# Patient Record
Sex: Male | Born: 1950 | Race: White | Hispanic: No | Marital: Single | Smoking: Former smoker
Health system: Southern US, Community
[De-identification: ages and names within clinical notes are randomized; demographics above are authoritative.]

## PROBLEM LIST (undated history)

## (undated) DIAGNOSIS — Z9989 Dependence on other enabling machines and devices: Secondary | ICD-10-CM

## (undated) DIAGNOSIS — I10 Essential (primary) hypertension: Secondary | ICD-10-CM

## (undated) DIAGNOSIS — G4733 Obstructive sleep apnea (adult) (pediatric): Secondary | ICD-10-CM

## (undated) DIAGNOSIS — E7800 Pure hypercholesterolemia, unspecified: Secondary | ICD-10-CM

## (undated) DIAGNOSIS — N529 Male erectile dysfunction, unspecified: Secondary | ICD-10-CM

## (undated) HISTORY — DX: Essential (primary) hypertension: I10

## (undated) HISTORY — DX: Obstructive sleep apnea (adult) (pediatric): G47.33

## (undated) HISTORY — DX: Dependence on other enabling machines and devices: Z99.89

## (undated) HISTORY — DX: Pure hypercholesterolemia, unspecified: E78.00

## (undated) HISTORY — DX: Male erectile dysfunction, unspecified: N52.9

## (undated) HISTORY — PX: NO PAST SURGERIES: SHX2092

---

## 1997-11-12 ENCOUNTER — Ambulatory Visit: Admission: RE | Admit: 1997-11-12 | Discharge: 1997-11-12 | Payer: Self-pay | Admitting: *Deleted

## 1999-12-20 ENCOUNTER — Emergency Department (HOSPITAL_COMMUNITY): Admission: EM | Admit: 1999-12-20 | Discharge: 1999-12-20 | Payer: Self-pay | Admitting: Emergency Medicine

## 2000-08-31 ENCOUNTER — Emergency Department (HOSPITAL_COMMUNITY): Admission: EM | Admit: 2000-08-31 | Discharge: 2000-08-31 | Payer: Self-pay | Admitting: *Deleted

## 2008-12-16 ENCOUNTER — Encounter: Payer: Self-pay | Admitting: Internal Medicine

## 2009-01-23 ENCOUNTER — Ambulatory Visit: Payer: Self-pay | Admitting: Internal Medicine

## 2009-01-23 DIAGNOSIS — G4733 Obstructive sleep apnea (adult) (pediatric): Secondary | ICD-10-CM

## 2009-01-23 DIAGNOSIS — J310 Chronic rhinitis: Secondary | ICD-10-CM

## 2009-01-29 DIAGNOSIS — I10 Essential (primary) hypertension: Secondary | ICD-10-CM | POA: Insufficient documentation

## 2009-01-29 DIAGNOSIS — E785 Hyperlipidemia, unspecified: Secondary | ICD-10-CM | POA: Insufficient documentation

## 2009-02-17 ENCOUNTER — Encounter: Payer: Self-pay | Admitting: Internal Medicine

## 2009-10-27 ENCOUNTER — Ambulatory Visit (HOSPITAL_COMMUNITY): Admission: RE | Admit: 2009-10-27 | Discharge: 2009-10-27 | Payer: Self-pay | Admitting: Cardiology

## 2009-10-27 ENCOUNTER — Ambulatory Visit: Payer: Self-pay | Admitting: Cardiology

## 2010-03-03 NOTE — Letter (Signed)
Summary: [facility]   Imported By: Sherian Rein 03/03/2009 08:28:53  _____________________________________________________________________  External Attachment:    Type:   Image     Comment:   External Document

## 2010-04-21 ENCOUNTER — Encounter: Payer: Self-pay | Admitting: *Deleted

## 2010-04-21 DIAGNOSIS — I10 Essential (primary) hypertension: Secondary | ICD-10-CM | POA: Insufficient documentation

## 2010-04-21 DIAGNOSIS — G4733 Obstructive sleep apnea (adult) (pediatric): Secondary | ICD-10-CM | POA: Insufficient documentation

## 2010-04-21 DIAGNOSIS — R011 Cardiac murmur, unspecified: Secondary | ICD-10-CM | POA: Insufficient documentation

## 2010-04-21 DIAGNOSIS — E7800 Pure hypercholesterolemia, unspecified: Secondary | ICD-10-CM | POA: Insufficient documentation

## 2010-04-21 DIAGNOSIS — Z9989 Dependence on other enabling machines and devices: Secondary | ICD-10-CM | POA: Insufficient documentation

## 2010-04-22 ENCOUNTER — Ambulatory Visit: Payer: Self-pay | Admitting: Cardiology

## 2010-05-18 ENCOUNTER — Other Ambulatory Visit: Payer: Self-pay | Admitting: *Deleted

## 2010-05-18 DIAGNOSIS — E7800 Pure hypercholesterolemia, unspecified: Secondary | ICD-10-CM

## 2010-05-21 ENCOUNTER — Ambulatory Visit (INDEPENDENT_AMBULATORY_CARE_PROVIDER_SITE_OTHER): Payer: BC Managed Care – PPO | Admitting: Cardiology

## 2010-05-21 ENCOUNTER — Other Ambulatory Visit (INDEPENDENT_AMBULATORY_CARE_PROVIDER_SITE_OTHER): Payer: BC Managed Care – PPO | Admitting: *Deleted

## 2010-05-21 ENCOUNTER — Encounter: Payer: Self-pay | Admitting: Cardiology

## 2010-05-21 DIAGNOSIS — E7800 Pure hypercholesterolemia, unspecified: Secondary | ICD-10-CM

## 2010-05-21 DIAGNOSIS — E785 Hyperlipidemia, unspecified: Secondary | ICD-10-CM

## 2010-05-21 DIAGNOSIS — I421 Obstructive hypertrophic cardiomyopathy: Secondary | ICD-10-CM

## 2010-05-21 LAB — BASIC METABOLIC PANEL
Calcium: 9.5 mg/dL (ref 8.4–10.5)
Creatinine, Ser: 1.1 mg/dL (ref 0.4–1.5)
GFR: 74.57 mL/min (ref >60.00)
Sodium: 142 mEq/L (ref 135–145)

## 2010-05-21 LAB — LIPID PANEL
HDL: 38.4 mg/dL — ABNORMAL LOW (ref >39.00)
LDL Cholesterol: 81 mg/dL (ref 0–99)
Total CHOL/HDL Ratio: 4
Triglycerides: 174 mg/dL — ABNORMAL HIGH (ref 0.0–149.0)
VLDL: 34.8 mg/dL (ref 0.0–40.0)

## 2010-05-21 LAB — HEPATIC FUNCTION PANEL
Alkaline Phosphatase: 61 U/L (ref 39–117)
Bilirubin, Direct: 0.1 mg/dL (ref 0.0–0.3)
Total Bilirubin: 1 mg/dL (ref 0.3–1.2)

## 2010-05-21 MED ORDER — METOPROLOL SUCCINATE ER 100 MG PO TB24: 100.0000 mg | ORAL_TABLET | Freq: Every day | ORAL | Status: DC

## 2010-05-21 NOTE — Assessment & Plan Note (Addendum)
This patient has a history of IHSS.  His last echocardiogram was 10/27/09.  Since last visit he's been having some occasional dizzy spells.  His not been expressing any chest pain.  His wife notes that he tends to be appearing to be flushed a lot of the time.  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  He does note some occasional exertional dyspnea.  He has been trying to be careful with his diet in terms of cholesterol

## 2010-05-21 NOTE — Assessment & Plan Note (Signed)
The patient remains on Simvastatin 40 mg daily for cholesterol.  He has not been expressing any myalgias.  He has not been getting much regular exercise however.

## 2010-05-21 NOTE — Progress Notes (Signed)
Wayne Beck Date of Birth:  04-Mar-1950 [facility] / [facility] [address]   [address] [place], [place]  [address] [phone]           Fax   [phone]  History of Present Illness: This 60 year old gentleman is seen for a six-month followup office visit.  He has a history of IHSS.  He also has a history of essential hypertension and hypercholesterolemia.  He has a known loud precordial murmur.  He has had some but not total improvement on beta blockers.  He also has a history of sleep apnea and sees Dr. Maple Hudson and uses a CPAP machine.  At the time of his last echocardiogram on 10/27/09 the left ventricular outflow gradient had improved to a maximum of 18 mm mercury.  He does have systolic anterior motion of the mitral valve.  He had normal systolic and diastolic function by echo.  Current Outpatient Prescriptions  Medication Sig Dispense Refill  . aspirin 81 MG tablet Take 81 mg by mouth daily.        . metoprolol (TOPROL-XL) 100 MG 24 hr tablet Take 1 tablet (100 mg total) by mouth daily. Take one in morning and 1/2 tablet in P.M.      . simvastatin (ZOCOR) 40 MG tablet Take 40 mg by mouth at bedtime.        . tadalafil (CIALIS) 20 MG tablet Take 20 mg by mouth as needed.        . vardenafil (LEVITRA) 10 MG tablet Take 10 mg by mouth as needed.        Marland Kitchen DISCONTD: metoprolol (TOPROL-XL) 100 MG 24 hr tablet Take 100 mg by mouth daily.          Allergies no known allergies  Patient Active Problem List  Diagnoses  . HYPERLIPIDEMIA  . OBSTRUCTIVE SLEEP APNEA  . HYPERTENSION  . CHRONIC RHINITIS  . Hypertrophic obstructive cardiomyopathy  . Murmur  . OSA on CPAP    History  Smoking status  . Former Smoker  . Types: Cigarettes  . Quit date: 04/21/1971  Smokeless tobacco  . Not on file    History  Alcohol Use     Family History  Problem Relation Age of Onset  . Other Father     CARDIOMEGALY  . Cancer Father   . ALS Mother     Review of  Systems: Constitutional: no fever chills diaphoresis or fatigue or change in weight.  Head and neck: no hearing loss, no epistaxis, no photophobia or visual disturbance. Respiratory: No cough, shortness of breath or wheezing. Cardiovascular: No chest pain peripheral edema, palpitations. Gastrointestinal: No abdominal distention, no abdominal pain, no change in bowel habits hematochezia or melena. Genitourinary: No dysuria, no frequency, no urgency, no nocturia. Musculoskeletal:No arthralgias, no back pain, no gait disturbance or myalgias. Neurological: No dizziness, no headaches, no numbness, no seizures, no syncope, no weakness, no tremors. Hematologic: No lymphadenopathy, no easy bruising. Psychiatric: No confusion, no hallucinations, no sleep disturbance.    Physical Exam: Filed Vitals:   05/21/10 1034  BP: 120/70  Pulse: 65  The general appearance reveals a well-developed well-nourished gentleman in no distress.Pupils equal and reactive.   Extraocular Movements are full.  There is no scleral icterus.  The mouth and pharynx are normal.  The neck is supple.  The carotids reveal no bruits.  The jugular venous pressure is normal.  The thyroid is not enlarged.  There is no lymphadenopathy.The chest is clear to percussion  and auscultation. There are no rales or rhonchi. Expansion of the chest is symmetrical.  Heart reveals a grade 2/6 systolic ejection murmur at the base and also heard at the apex.  No diastolic murmur.  No gallop or rub.The abdomen is soft and nontender. Bowel sounds are normal. The liver and spleen are not enlarged. There Are no abdominal masses. There are no bruits.The pedal pulses are good.  There is no phlebitis or edema.  There is no cyanosis or clubbing.   Assessment / Plan: The EKG today shows normal sinus rhythm with increased pattern of left ventricular strain when compared to 06/26/09 heart rate is 65.  We will keep his morning Toprol at 100 mg And he will take another  half of 100 tablet in the evening.  Recheck in 3 months for followup office visit and EKG

## 2010-05-22 ENCOUNTER — Encounter: Payer: Self-pay | Admitting: Cardiology

## 2010-05-26 ENCOUNTER — Telehealth: Payer: Self-pay | Admitting: *Deleted

## 2010-05-26 NOTE — Telephone Encounter (Signed)
Advised of labs 

## 2010-05-26 NOTE — Telephone Encounter (Signed)
Message copied by Regis Bill on Tue May 26, 2010 10:32 AM ------      Message from: Cassell Clement      Created: Fri May 22, 2010  7:12 AM       Liver and kidney tests are good.  Blood sugar normal .Triglycerides are slightly high.  Work on increased exercise and weight loss and avoid carbohydrates.  Continue same medication.

## 2010-05-26 NOTE — Progress Notes (Signed)
Advised of labs 

## 2010-06-08 ENCOUNTER — Other Ambulatory Visit: Payer: Self-pay | Admitting: Cardiovascular Disease

## 2010-06-08 MED ORDER — METOPROLOL SUCCINATE ER 100 MG PO TB24: ORAL_TABLET | ORAL | Status: DC

## 2010-06-08 MED ORDER — METOPROLOL SUCCINATE ER 100 MG PO TB24: 100.0000 mg | ORAL_TABLET | Freq: Every day | ORAL | Status: DC

## 2010-06-08 NOTE — Telephone Encounter (Signed)
Medication refill

## 2010-08-11 ENCOUNTER — Telehealth: Payer: Self-pay | Admitting: Cardiology

## 2010-08-11 ENCOUNTER — Other Ambulatory Visit: Payer: Self-pay | Admitting: *Deleted

## 2010-08-11 DIAGNOSIS — I119 Hypertensive heart disease without heart failure: Secondary | ICD-10-CM

## 2010-08-11 NOTE — Telephone Encounter (Signed)
Left message

## 2010-08-11 NOTE — Telephone Encounter (Signed)
recvd call from patient needs to schedule appt.

## 2010-08-11 NOTE — Telephone Encounter (Signed)
Scheduled appointment with Lawson Fiscal for next week

## 2010-08-18 ENCOUNTER — Encounter: Payer: Self-pay | Admitting: Cardiology

## 2010-08-19 ENCOUNTER — Encounter: Payer: Self-pay | Admitting: Nurse Practitioner

## 2010-08-19 ENCOUNTER — Ambulatory Visit (INDEPENDENT_AMBULATORY_CARE_PROVIDER_SITE_OTHER): Payer: BC Managed Care – PPO | Admitting: Nurse Practitioner

## 2010-08-19 DIAGNOSIS — I119 Hypertensive heart disease without heart failure: Secondary | ICD-10-CM

## 2010-08-19 DIAGNOSIS — I421 Obstructive hypertrophic cardiomyopathy: Secondary | ICD-10-CM

## 2010-08-19 DIAGNOSIS — E785 Hyperlipidemia, unspecified: Secondary | ICD-10-CM

## 2010-08-19 NOTE — Assessment & Plan Note (Signed)
He seems to be doing ok. He does admit to missing the evening dose of Toprol fairly frequently. I have asked him to take it with his nighttime dose of Zocor. He thinks this be better. We will update the echo in September. I have encouraged him to continue with weight loss. Will see him back in 4 months with fasting labs. Patient is agreeable to this plan and will call if any problems develop in the interim.

## 2010-08-19 NOTE — Patient Instructions (Signed)
Stay on your current medicines. Try the 2nd dose of metoprolol at night with your Zocor We will check an ultrasound of your heart in September I will have you see Dr. Patty Sermons in about 4 months with fasting labs Call for any problems Let us know if you have any more palpitations. We will need to put a monitor on

## 2010-08-19 NOTE — Progress Notes (Signed)
    Wayne Beck Date of Birth: 09-28-1950   History of Present Illness: Wayne Beck is seen back today for his 3 month visit. He is seen for Wayne Beck. He has a known hypertrophic cardiomyopathy. He is on beta blocker therapy. His dose was increased at his last visit. He admits to missing the evening dose quite often. No chest pain. He has had one episode of some "heart flutter". It occurred about 2 weeks ago at night and only lasted a couple of minutes. It has not returned. He is actively losing weight. No shortness of breath unless he really exerts himself. No syncope.   Current Outpatient Prescriptions on File Prior to Visit  Medication Sig Dispense Refill  . aspirin 81 MG tablet Take 81 mg by mouth daily.        . metoprolol (TOPROL-XL) 100 MG 24 hr tablet Take one in morning and 1/2 tablet in P.M.  45 tablet  11  . simvastatin (ZOCOR) 40 MG tablet Take 40 mg by mouth at bedtime.        . vardenafil (LEVITRA) 10 MG tablet Take 10 mg by mouth as needed.          No Known Allergies  Past Medical History  Diagnosis Date  . Hypercholesteremia   . HTN (hypertension)   . Hypertrophic obstructive cardiomyopathy   . Murmur   . OSA on CPAP   . Erectile dysfunction     History reviewed. No pertinent past surgical history.  History  Smoking status  . Former Smoker  . Types: Cigarettes  . Quit date: 04/21/1971  Smokeless tobacco  . Not on file    History  Alcohol Use No    Family History  Problem Relation Age of Onset  . Other Father     CARDIOMEGALY  . Cancer Father   . ALS Mother     Review of Systems: The review of systems is as above.   All other systems were reviewed and are negative.  Physical Exam: BP 116/66  Pulse 64  Ht 6\' 2"  (1.88 m)  Wt 222 lb 3.2 oz (100.789 kg)  BMI 28.53 kg/m2 Patient is very pleasant and in no acute distress. Skin is warm and dry. Color is normal.  HEENT is unremarkable. Normocephalic/atraumatic. PERRL. Sclera are nonicteric.  Neck is supple. No masses. No JVD. Lungs are clear. Cardiac exam shows a regular rate and rhythm. He has a grade 2/6 murmur Abdomen is soft. Extremities are without edema. Gait and ROM are intact. No gross neurologic deficits noted.  LABORATORY DATA: EKG shows sinus brady with LVH   Assessment / Plan:

## 2010-08-20 ENCOUNTER — Encounter: Payer: Self-pay | Admitting: Nurse Practitioner

## 2010-10-12 ENCOUNTER — Encounter: Payer: Self-pay | Admitting: *Deleted

## 2010-10-19 ENCOUNTER — Other Ambulatory Visit (HOSPITAL_COMMUNITY): Payer: BC Managed Care – PPO | Admitting: Radiology

## 2010-10-22 ENCOUNTER — Ambulatory Visit (HOSPITAL_COMMUNITY): Payer: BC Managed Care – PPO | Attending: Cardiology | Admitting: Radiology

## 2010-10-22 DIAGNOSIS — I10 Essential (primary) hypertension: Secondary | ICD-10-CM | POA: Insufficient documentation

## 2010-10-22 DIAGNOSIS — I119 Hypertensive heart disease without heart failure: Secondary | ICD-10-CM

## 2010-10-22 DIAGNOSIS — I379 Nonrheumatic pulmonary valve disorder, unspecified: Secondary | ICD-10-CM | POA: Insufficient documentation

## 2010-10-22 DIAGNOSIS — I421 Obstructive hypertrophic cardiomyopathy: Secondary | ICD-10-CM | POA: Insufficient documentation

## 2010-10-22 DIAGNOSIS — E785 Hyperlipidemia, unspecified: Secondary | ICD-10-CM | POA: Insufficient documentation

## 2010-10-22 DIAGNOSIS — I059 Rheumatic mitral valve disease, unspecified: Secondary | ICD-10-CM | POA: Insufficient documentation

## 2010-10-28 ENCOUNTER — Telehealth: Payer: Self-pay | Admitting: *Deleted

## 2010-10-28 NOTE — Telephone Encounter (Signed)
Message copied by Eugenia Pancoast on Wed Oct 28, 2010  2:03 PM ------      Message from: Cassell Clement      Created: Mon Oct 26, 2010  9:52 AM       Please report.  The echocardiogram still shows the evidence of IHSS.  I want him to continue his present dose of metoprolol and try to remember to take the evening dose as well.

## 2010-10-28 NOTE — Telephone Encounter (Signed)
Advised of echo results 

## 2010-12-08 ENCOUNTER — Ambulatory Visit (INDEPENDENT_AMBULATORY_CARE_PROVIDER_SITE_OTHER): Payer: BC Managed Care – PPO | Admitting: Cardiology

## 2010-12-08 ENCOUNTER — Other Ambulatory Visit (INDEPENDENT_AMBULATORY_CARE_PROVIDER_SITE_OTHER): Payer: BC Managed Care – PPO | Admitting: *Deleted

## 2010-12-08 ENCOUNTER — Other Ambulatory Visit: Payer: BC Managed Care – PPO | Admitting: *Deleted

## 2010-12-08 ENCOUNTER — Encounter: Payer: Self-pay | Admitting: Cardiology

## 2010-12-08 VITALS — BP 112/62 | HR 64 | Ht 74.0 in | Wt 221.0 lb

## 2010-12-08 DIAGNOSIS — E7800 Pure hypercholesterolemia, unspecified: Secondary | ICD-10-CM

## 2010-12-08 DIAGNOSIS — I119 Hypertensive heart disease without heart failure: Secondary | ICD-10-CM

## 2010-12-08 DIAGNOSIS — I421 Obstructive hypertrophic cardiomyopathy: Secondary | ICD-10-CM

## 2010-12-08 DIAGNOSIS — E785 Hyperlipidemia, unspecified: Secondary | ICD-10-CM

## 2010-12-08 LAB — BASIC METABOLIC PANEL
BUN: 15 mg/dL (ref 6–23)
CO2: 28 mEq/L (ref 19–32)
Calcium: 9 mg/dL (ref 8.4–10.5)
Chloride: 107 mEq/L (ref 96–112)
Creatinine, Ser: 1.1 mg/dL (ref 0.4–1.5)
GFR: 70.62 mL/min (ref >60.00)
Glucose, Bld: 101 mg/dL — ABNORMAL HIGH (ref 70–99)
Potassium: 4.2 mEq/L (ref 3.5–5.1)
Sodium: 141 mEq/L (ref 135–145)

## 2010-12-08 LAB — LIPID PANEL
Cholesterol: 151 mg/dL (ref 0–200)
HDL: 43.5 mg/dL (ref >39.00)
LDL Cholesterol: 82 mg/dL (ref 0–99)
Total CHOL/HDL Ratio: 3
Triglycerides: 130 mg/dL (ref 0.0–149.0)
VLDL: 26 mg/dL (ref 0.0–40.0)

## 2010-12-08 LAB — HEPATIC FUNCTION PANEL
ALT: 27 U/L (ref 0–53)
AST: 21 U/L (ref 0–37)
Albumin: 3.9 g/dL (ref 3.5–5.2)
Alkaline Phosphatase: 58 U/L (ref 39–117)
Bilirubin, Direct: 0.1 mg/dL (ref 0.0–0.3)
Total Bilirubin: 0.7 mg/dL (ref 0.3–1.2)
Total Protein: 6.8 g/dL (ref 6.0–8.3)

## 2010-12-08 MED ORDER — METOPROLOL SUCCINATE ER 100 MG PO TB24: 100.0000 mg | ORAL_TABLET | Freq: Two times a day (BID) | ORAL | Status: DC

## 2010-12-08 NOTE — Assessment & Plan Note (Signed)
The patient has a history of hyperlipidemia.  We're checking fasting lab work today results pending.  He is tolerating simvastatin 40 mg daily.

## 2010-12-08 NOTE — Patient Instructions (Addendum)
Increase Toprol to 100 mg twice daily  Your physician recommends that you schedule a follow-up appointment in: 4 months with Lawson Fiscal NP or  Dr. Patty Sermons

## 2010-12-08 NOTE — Progress Notes (Signed)
Wayne Beck Date of Birth:  11-Feb-1950 [facility] / [facility] [address]   [address] [place], [place]  [address] [phone]           Fax   [phone]  HPI: This pleasant 60 year old gentleman is seen for a scheduled followup office visit.  He has a known diagnosis of IHSS.  He had a recent echocardiogram in September which showed no significant change in his left ventricular wall thickness.  He has not been experiencing any chest pain.  Current Outpatient Prescriptions  Medication Sig Dispense Refill  . aspirin 81 MG tablet Take 81 mg by mouth daily.        . simvastatin (ZOCOR) 40 MG tablet Take 40 mg by mouth at bedtime.        . vardenafil (LEVITRA) 10 MG tablet Take 10 mg by mouth as needed.        . metoprolol (TOPROL-XL) 100 MG 24 hr tablet Take 1 tablet (100 mg total) by mouth 2 (two) times daily.  60 tablet  11  . DISCONTD: metoprolol (TOPROL-XL) 100 MG 24 hr tablet Take one in morning and 1/2 tablet in P.M.  45 tablet  11    No Known Allergies  Patient Active Problem List  Diagnoses  . HYPERLIPIDEMIA  . OBSTRUCTIVE SLEEP APNEA  . HYPERTENSION  . CHRONIC RHINITIS  . Hypertrophic obstructive cardiomyopathy  . Murmur  . OSA on CPAP    History  Smoking status  . Former Smoker  . Types: Cigarettes  . Quit date: 04/21/1971  Smokeless tobacco  . Not on file    History  Alcohol Use No    Family History  Problem Relation Age of Onset  . Other Father     CARDIOMEGALY  . Cancer Father   . ALS Mother     Review of Systems: The patient denies any heat or cold intolerance.  No weight gain or weight loss.  The patient denies headaches or blurry vision.  There is no cough or sputum production.  The patient denies dizziness.  There is no hematuria or hematochezia.  The patient denies any muscle aches or arthritis.  The patient denies any rash.  The patient denies frequent falling or instability.  There is no history of depression or anxiety.   All other systems were reviewed and are negative.   Physical Exam: Filed Vitals:   12/08/10 0903  BP: 112/62  Pulse: 64   general appearance reveals a well-developed well-nourished gentleman in no distress.Pupils equal and reactive.   Extraocular Movements are full.  There is no scleral icterus.  The mouth and pharynx are normal.  The neck is supple.  The carotids reveal no bruits.  The jugular venous pressure is normal.  The thyroid is not enlarged.  There is no lymphadenopathy.  The chest is clear to percussion and auscultation. There are no rales or rhonchi. Expansion of the chest is symmetrical.  Heart reveals a grade 3/6 harsh systolic ejection murmur at the lower left sternal edge.  No gallop or rubThe abdomen is soft and nontender. Bowel sounds are normal. The liver and spleen are not enlarged. There Are no abdominal masses. There are no bruits.  The pedal pulses are good.  There is no phlebitis or edema.  There is no cyanosis or clubbing. Strength is normal and symmetrical in all extremities.  There is no lateralizing weakness.  There are no sensory deficits.  The skin is warm and dry.  There  is no rash.     Assessment / Plan:  Increase Toprol 100 mg twice a day.  Recheck in 4 months for a followup office visit and EKG

## 2010-12-08 NOTE — Assessment & Plan Note (Signed)
The patient has a history of IHSS.  He has been experiencing some exertional dyspnea although less than before.  He also notes occasional dizzy spells with change in position.  Presently he is taking Toprol 100 mg in the morning and 50 mg in the evening and we will increase his beta blocker to 100 mg twice a day in an effort to decrease his left ventricular outflow obstruction and decrease his murmur.

## 2010-12-09 ENCOUNTER — Telehealth: Payer: Self-pay | Admitting: *Deleted

## 2010-12-09 NOTE — Progress Notes (Signed)
No answer

## 2010-12-09 NOTE — Telephone Encounter (Signed)
Advised of labs 

## 2010-12-09 NOTE — Telephone Encounter (Signed)
Message copied by Burnell Blanks on Wed Dec 09, 2010  5:25 PM ------      Message from: Cassell Clement      Created: Tue Dec 08, 2010  9:01 PM       Please report.  The labs are stable.  Continue same meds.  Continue careful diet.            The TGs are down to normal now.  Cholesterol remains good.

## 2010-12-15 ENCOUNTER — Other Ambulatory Visit: Payer: Self-pay | Admitting: Internal Medicine

## 2010-12-15 DIAGNOSIS — N5089 Other specified disorders of the male genital organs: Secondary | ICD-10-CM

## 2010-12-16 ENCOUNTER — Ambulatory Visit
Admission: RE | Admit: 2010-12-16 | Discharge: 2010-12-16 | Disposition: A | Discharge status: Home / Self Care | Payer: BC Managed Care – PPO | Source: Ambulatory Visit | Attending: Internal Medicine | Admitting: Internal Medicine

## 2010-12-16 DIAGNOSIS — N5089 Other specified disorders of the male genital organs: Secondary | ICD-10-CM

## 2011-01-02 ENCOUNTER — Other Ambulatory Visit: Payer: Self-pay | Admitting: Cardiology

## 2011-01-08 ENCOUNTER — Other Ambulatory Visit: Payer: Self-pay | Admitting: *Deleted

## 2011-01-08 MED ORDER — VARDENAFIL HCL 10 MG PO TABS: 10.0000 mg | ORAL_TABLET | ORAL | Status: DC | PRN

## 2011-01-08 NOTE — Telephone Encounter (Signed)
Refilled levitra

## 2011-02-10 ENCOUNTER — Other Ambulatory Visit: Payer: Self-pay | Admitting: *Deleted

## 2011-02-10 MED ORDER — SIMVASTATIN 40 MG PO TABS: 40.0000 mg | ORAL_TABLET | Freq: Every day | ORAL | Status: DC

## 2011-09-09 ENCOUNTER — Other Ambulatory Visit: Payer: Self-pay | Admitting: *Deleted

## 2011-09-09 MED ORDER — SIMVASTATIN 40 MG PO TABS: 40.0000 mg | ORAL_TABLET | Freq: Every day | ORAL | Status: DC

## 2011-09-09 NOTE — Telephone Encounter (Signed)
Refilled simvastatin.

## 2011-12-13 ENCOUNTER — Encounter: Payer: Self-pay | Admitting: Cardiology

## 2011-12-13 ENCOUNTER — Ambulatory Visit (INDEPENDENT_AMBULATORY_CARE_PROVIDER_SITE_OTHER): Payer: BC Managed Care – PPO | Admitting: Cardiology

## 2011-12-13 VITALS — BP 132/70 | HR 57 | Resp 18 | Ht 74.0 in | Wt 233.0 lb

## 2011-12-13 DIAGNOSIS — R079 Chest pain, unspecified: Secondary | ICD-10-CM | POA: Insufficient documentation

## 2011-12-13 DIAGNOSIS — E785 Hyperlipidemia, unspecified: Secondary | ICD-10-CM

## 2011-12-13 DIAGNOSIS — I421 Obstructive hypertrophic cardiomyopathy: Secondary | ICD-10-CM

## 2011-12-13 MED ORDER — SIMVASTATIN 40 MG PO TABS: 40.0000 mg | ORAL_TABLET | Freq: Every day | ORAL | Status: DC

## 2011-12-13 MED ORDER — METOPROLOL SUCCINATE ER 100 MG PO TB24: ORAL_TABLET | ORAL | Status: DC

## 2011-12-13 NOTE — Assessment & Plan Note (Signed)
The patient has not been experiencing any exertional chest pain.  A week ago he awoke at 3 AM with an indigestion-like pain.  He had eaten a large amount of chili earlier that evening for supper.  He took 2 aspirin and took some Maalox and the discomfort gradually subsided.  EKG today shows no significant change from prior tracings.

## 2011-12-13 NOTE — Assessment & Plan Note (Signed)
The patient has a history of hypercholesterolemia.  He remains on simvastatin.  He is not having any myalgias.  His lipids are followed by his primary care physician

## 2011-12-13 NOTE — Progress Notes (Signed)
[name] Date of Birth:  1950-07-27 [facility] [address] [place], [place]  [address] [phone]         Fax   [phone]  History of Present Illness: This pleasant 61 year old gentleman is seen after a one-year absence.  He has a history of known IHSS.  He does not have any history of ischemic heart disease.  He had a nuclear stress test 03/30/06 which was normal.  His last echocardiogram was 10/22/10 and showed an ejection fraction of 65-70% with left ventricular outflow tract obstruction.  His septum measures 16 mm. Since we last saw him the patient was also seen by a cardiologist at [facility] who concurred with his medical regimen.  The patient was also invited to engage in a research study at [facility] but declined to do so.   Current Outpatient Prescriptions  Medication Sig Dispense Refill  . aspirin 81 MG tablet Take 81 mg by mouth daily.        . metoprolol succinate (TOPROL-XL) 100 MG 24 hr tablet Take 1 and 1/2 tablet twice a day  90 tablet  11  . simvastatin (ZOCOR) 40 MG tablet Take 1 tablet (40 mg total) by mouth at bedtime.  30 tablet  5  . vardenafil (LEVITRA) 10 MG tablet Take 1 tablet (10 mg total) by mouth as needed.  10 tablet  0  . [DISCONTINUED] metoprolol (TOPROL-XL) 100 MG 24 hr tablet Take 1 tablet (100 mg total) by mouth 2 (two) times daily.  60 tablet  11  . [DISCONTINUED] simvastatin (ZOCOR) 40 MG tablet Take 1 tablet (40 mg total) by mouth at bedtime.  30 tablet  3    No Known Allergies  Patient Active Problem List  Diagnosis  . HYPERLIPIDEMIA  . OBSTRUCTIVE SLEEP APNEA  . HYPERTENSION  . CHRONIC RHINITIS  . Hypertrophic obstructive cardiomyopathy  . Murmur  . OSA on CPAP    History  Smoking status  . Former Smoker  . Types: Cigarettes  . Quit date: 04/21/1971  Smokeless tobacco  . Not on file    History  Alcohol Use No    Family History  Problem Relation Age of Onset  . Other Father     CARDIOMEGALY  . Cancer  Father   . ALS Mother     Review of Systems: Constitutional: no fever chills diaphoresis or fatigue or change in weight.  Head and neck: no hearing loss, no epistaxis, no photophobia or visual disturbance. Respiratory: No cough, shortness of breath or wheezing. Cardiovascular: No chest pain peripheral edema, palpitations. Gastrointestinal: No abdominal distention, no abdominal pain, no change in bowel habits hematochezia or melena. Genitourinary: No dysuria, no frequency, no urgency, no nocturia. Musculoskeletal:No arthralgias, no back pain, no gait disturbance or myalgias. Neurological: No dizziness, no headaches, no numbness, no seizures, no syncope, no weakness, no tremors. Hematologic: No lymphadenopathy, no easy bruising. Psychiatric: No confusion, no hallucinations, no sleep disturbance.    Physical Exam: Filed Vitals:   12/13/11 1448  BP: 132/70  Pulse: 57  Resp:    the general appearance reveals a well-developed well-nourished gentleman in no distress.The head and neck exam reveals pupils equal and reactive.  Extraocular movements are full.  There is no scleral icterus.  The mouth and pharynx are normal.  The neck is supple.  The carotids reveal no bruits.  The jugular venous pressure is normal.  The  thyroid is not enlarged.  There is no lymphadenopathy.  The chest is clear  to percussion and auscultation.  There are no rales or rhonchi.  Expansion of the chest is symmetrical.  The precordium is quiet.  The first heart sound is normal.  The second heart sound is physiologically split.  There is a loud grade 3/6 harsh systolic ejection murmur at the left sternal edge which radiates widely across the precordium.  No diastolic murmur  There is no abnormal lift or heave.  The abdomen is soft and nontender.  The bowel sounds are normal.  The liver and spleen are not enlarged.  There are no abdominal masses.  There are no abdominal bruits.  Extremities reveal good pedal pulses.  There is no  phlebitis or edema.  There is no cyanosis or clubbing.  Strength is normal and symmetrical in all extremities.  There is no lateralizing weakness.  There are no sensory deficits.  The skin is warm and dry.  There is no rash.  EKG shows sinus bradycardia and LVH with strain.  The strain pattern has not increased since 08/19/10   Assessment / Plan: The patient still has a significant outflow gradient.  We are going to increase his metoprolol to 150 mg twice a day.  He is to return in 6 months for followup office visit and EKG. the patient needs to make a serious effort to lose weight and to get back into a regular walking exercise program

## 2011-12-13 NOTE — Patient Instructions (Addendum)
INCREASE YOUR METOPROLOL SUCC. TO 1 AND 1/2 TABLET TWICE A DAY  Your physician wants you to follow-up in: 6 month ov You will receive a reminder letter in the mail two months in advance. If you don't receive a letter, please call our office to schedule the follow-up appointment.

## 2011-12-13 NOTE — Assessment & Plan Note (Signed)
The patient does experience some exertional dyspnea.  He has not been getting any regular exercise.  His weight is up 12 pounds since we saw him last year.

## 2012-01-07 ENCOUNTER — Encounter: Payer: Self-pay | Admitting: Cardiology

## 2012-02-09 ENCOUNTER — Other Ambulatory Visit: Payer: Self-pay

## 2012-02-09 MED ORDER — SIMVASTATIN 40 MG PO TABS: 40.0000 mg | ORAL_TABLET | Freq: Every day | ORAL | Status: DC

## 2012-02-14 ENCOUNTER — Other Ambulatory Visit: Payer: Self-pay

## 2012-02-14 MED ORDER — SIMVASTATIN 40 MG PO TABS: 40.0000 mg | ORAL_TABLET | Freq: Every day | ORAL | Status: DC

## 2012-03-10 ENCOUNTER — Encounter (HOSPITAL_COMMUNITY): Payer: Self-pay | Admitting: Adult Health

## 2012-03-10 ENCOUNTER — Emergency Department (HOSPITAL_COMMUNITY): Payer: BC Managed Care – PPO

## 2012-03-10 ENCOUNTER — Emergency Department (HOSPITAL_COMMUNITY)
Admission: EM | Admit: 2012-03-10 | Discharge: 2012-03-11 | Disposition: A | Discharge status: Home / Self Care | Payer: BC Managed Care – PPO | Attending: Emergency Medicine | Admitting: Emergency Medicine

## 2012-03-10 DIAGNOSIS — Z8679 Personal history of other diseases of the circulatory system: Secondary | ICD-10-CM | POA: Insufficient documentation

## 2012-03-10 DIAGNOSIS — Z87448 Personal history of other diseases of urinary system: Secondary | ICD-10-CM | POA: Insufficient documentation

## 2012-03-10 DIAGNOSIS — R0602 Shortness of breath: Secondary | ICD-10-CM | POA: Insufficient documentation

## 2012-03-10 DIAGNOSIS — Z87891 Personal history of nicotine dependence: Secondary | ICD-10-CM | POA: Insufficient documentation

## 2012-03-10 DIAGNOSIS — Z79899 Other long term (current) drug therapy: Secondary | ICD-10-CM | POA: Insufficient documentation

## 2012-03-10 DIAGNOSIS — G4733 Obstructive sleep apnea (adult) (pediatric): Secondary | ICD-10-CM | POA: Insufficient documentation

## 2012-03-10 DIAGNOSIS — I10 Essential (primary) hypertension: Secondary | ICD-10-CM | POA: Insufficient documentation

## 2012-03-10 DIAGNOSIS — Z7982 Long term (current) use of aspirin: Secondary | ICD-10-CM | POA: Insufficient documentation

## 2012-03-10 DIAGNOSIS — E7800 Pure hypercholesterolemia, unspecified: Secondary | ICD-10-CM | POA: Insufficient documentation

## 2012-03-10 DIAGNOSIS — Z9981 Dependence on supplemental oxygen: Secondary | ICD-10-CM | POA: Insufficient documentation

## 2012-03-10 DIAGNOSIS — I4891 Unspecified atrial fibrillation: Secondary | ICD-10-CM | POA: Insufficient documentation

## 2012-03-10 LAB — CBC
HCT: 47.7 % (ref 39.0–52.0)
Hemoglobin: 17.3 g/dL — ABNORMAL HIGH (ref 13.0–17.0)
MCH: 32.9 pg (ref 26.0–34.0)
MCHC: 36.3 g/dL — ABNORMAL HIGH (ref 30.0–36.0)
MCV: 90.7 fL (ref 78.0–100.0)
Platelets: 252 10*3/uL (ref 150–400)
RBC: 5.26 MIL/uL (ref 4.22–5.81)
RDW: 12.7 % (ref 11.5–15.5)
WBC: 7.5 10*3/uL (ref 4.0–10.5)

## 2012-03-10 NOTE — ED Notes (Signed)
Presents with sternal chest pain and feeling a heart fluttering began 20:30 and continued through the evening, pain is described as pressure associated with SOB, indigestion and gas. Denies nausea and vomitng. EKG completed.  Atrial fib on monitor.

## 2012-03-11 ENCOUNTER — Encounter (HOSPITAL_COMMUNITY): Payer: Self-pay | Admitting: Cardiology

## 2012-03-11 DIAGNOSIS — I4891 Unspecified atrial fibrillation: Secondary | ICD-10-CM

## 2012-03-11 LAB — BASIC METABOLIC PANEL
BUN: 13 mg/dL (ref 6–23)
CO2: 26 mEq/L (ref 19–32)
Calcium: 9.5 mg/dL (ref 8.4–10.5)
Chloride: 102 mEq/L (ref 96–112)
Creatinine, Ser: 0.99 mg/dL (ref 0.50–1.35)
GFR calc Af Amer: >90 mL/min (ref >90)
GFR calc non Af Amer: 85 mL/min — ABNORMAL LOW (ref >90)
Glucose, Bld: 158 mg/dL — ABNORMAL HIGH (ref 70–99)
Potassium: 4.5 mEq/L (ref 3.5–5.1)
Sodium: 137 mEq/L (ref 135–145)

## 2012-03-11 LAB — TSH: TSH: 3.668 u[IU]/mL (ref 0.350–4.500)

## 2012-03-11 LAB — MAGNESIUM: Magnesium: 2.4 mg/dL (ref 1.5–2.5)

## 2012-03-11 LAB — POCT I-STAT TROPONIN I

## 2012-03-11 NOTE — Consult Note (Signed)
CARDIOLOGY CONSULT NOTE  Patient ID: Wayne Beck MRN: [id number] DOB/AGE: 09-15-1950 62 y.o. The Admit date: 03/10/2012 Primary Physician Juline Patch, MD Primary Cardiologist Dr. Patty Sermons Chief Complaint  Palpitations  HPI:  The patient presents for without limitations. Has a history of hypertrophic cardiomyopathy and is followed by Dr. Patty Sermons. He has noticed some palpitations off and on for a couple of months. These have been short lived lasting for a couple of hours. Tonight he got back from eating dinner. He notices palpitations. He hadn't taken his evening dose of beta blocker so we took this. It persisted for longer than usual and he came to the emergency room where he was noted to be in atrial fibrillation with a heart rate of about 100. He had some very mild chest discomfort. He did not describe any presyncope or syncope. He otherwise has been doing relatively well. He's not particularly active but he does some walking. He has had no symptomatic limitations with this. He thinks he's had about 2 or 3 episodes of palpitations that come on sporadically. He's had no new shortness of breath, PND or orthopnea. He has had no weight gain or edema.    Past Medical History  Diagnosis Date  . Hypercholesteremia   . HTN (hypertension)   . Hypertrophic obstructive cardiomyopathy   . Murmur   . OSA on CPAP   . Erectile dysfunction      (Not in a hospital admission) Family History  Problem Relation Age of Onset  . Other Father     CARDIOMEGALY  . Cancer Father   . ALS Mother     History   Social History  . Marital Status: Single    Spouse Name: N/A    Number of Children: N/A  . Years of Education: N/A   Occupational History  . Not on file.   Social History Main Topics  . Smoking status: Former Smoker    Types: Cigarettes    Quit date: 04/21/1971  . Smokeless tobacco: Not on file  . Alcohol Use: No  . Drug Use: No  . Sexually Active: Yes   Other Topics Concern  .  Not on file   Social History Narrative  . No narrative on file     ROS:  As stated in the HPI and negative for all other systems.  Physical Exam: Blood pressure 124/77, pulse 110, temperature 97.8 F (36.6 C), temperature source Oral, resp. rate 18, weight 225 lb (102.059 kg), SpO2 96.00%.  GENERAL:  Well appearing HEENT:  Pupils equal round and reactive, fundi not visualized, oral mucosa unremarkable NECK:  No jugular venous distention, waveform within normal limits, carotid upstroke brisk and symmetric, no bruits, no thyromegaly LYMPHATICS:  No cervical, inguinal adenopathy LUNGS:  Clear to auscultation bilaterally BACK:  No CVA tenderness CHEST:  Unremarkable HEART:  PMI not displaced or sustained,S1 and S2 within normal limits, no S3, no S4, no clicks, no rubs, slight apical systolic murmur no increasing with a strain phase of Valsalva, no diastolic murmurs murmurs ABD:  Flat, positive bowel sounds normal in frequency in pitch, no bruits, no rebound, no guarding, no midline pulsatile mass, no hepatomegaly, no splenomegaly EXT:  2 plus pulses throughout, no edema, no cyanosis no clubbing SKIN:  No rashes no nodules NEURO:  Cranial nerves II through XII grossly intact, motor grossly intact throughout PSYCH:  Cognitively intact, oriented to person place and time   Labs: Lab Results  Component Value Date   BUN 13 03/10/2012  Lab Results  Component Value Date   CREATININE 0.99 03/10/2012   Lab Results  Component Value Date   NA 137 03/10/2012   K 4.5 03/10/2012   CL 102 03/10/2012   CO2 26 03/10/2012   No results found for this basename: CKTOTAL,  CKMB,  CKMBINDEX,  TROPONINI   Lab Results  Component Value Date   WBC 7.5 03/10/2012   HGB 17.3* 03/10/2012   HCT 47.7 03/10/2012   MCV 90.7 03/10/2012   PLT 252 03/10/2012    Radiology:  CXR:  NAD  EKG: Atrial fib, rate 110, LVH with repolarization changes.  ST T wave changes are not new compared with previous. 03/11/2012  ASSESSMENT AND  PLAN:  Atrial fibrillation:  The patient self limited run of atrial fibrillation lasting approximately 9 hours.  He is now back in normal sinus rhythm. At this point I will not change medical therapy.  I will refer him back to  Dr. Patty Sermons to consider antiarrhythmic therapy and systemic anticoagulation.   HCM:  The patient has had no related to this. I will suggest followup echocardiography which can be arranged at the time of his followup.  SignedRollene Rotunda 03/11/2012, 5:39 AM

## 2012-04-03 ENCOUNTER — Ambulatory Visit: Payer: BC Managed Care – PPO | Admitting: Cardiology

## 2012-05-23 IMAGING — US US SCROTUM
1 series · 14 of 25 positions shown · non-contrast
Comparison: None.

CLINICAL DATA: Left scrotal mass.  Question hernia versus
testicular mass.

ULTRASOUND OF SCROTUM
TECHNIQUE: Complete ultrasound examination of the testicles,
epididymis, and other scrotal structures was performed.

[Series 1: us scrotum · 66 acquisitions, 14 frames shown]
[im 1/66]
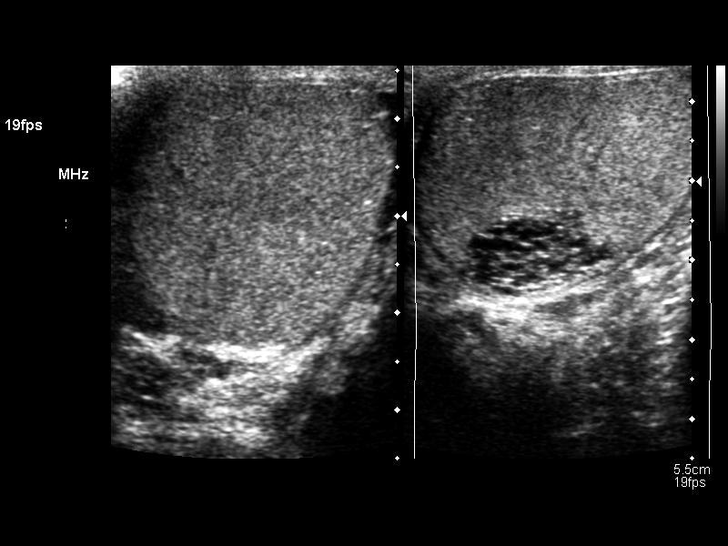
[im 6/66]
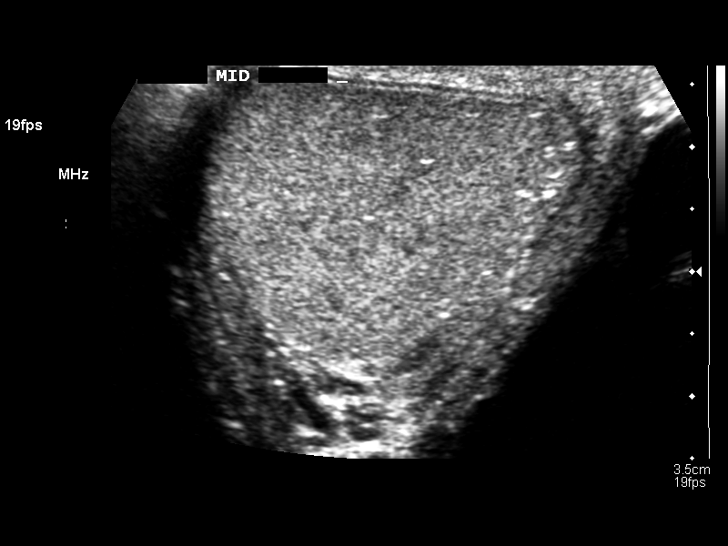
[im 11/66]
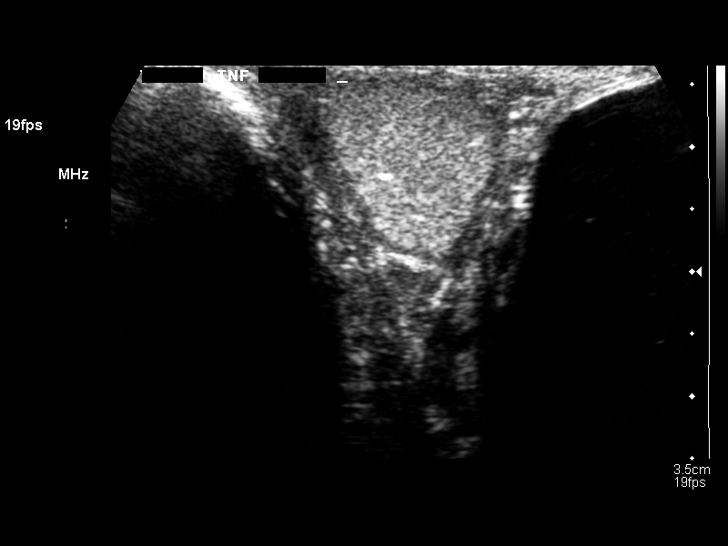
[im 17/66]
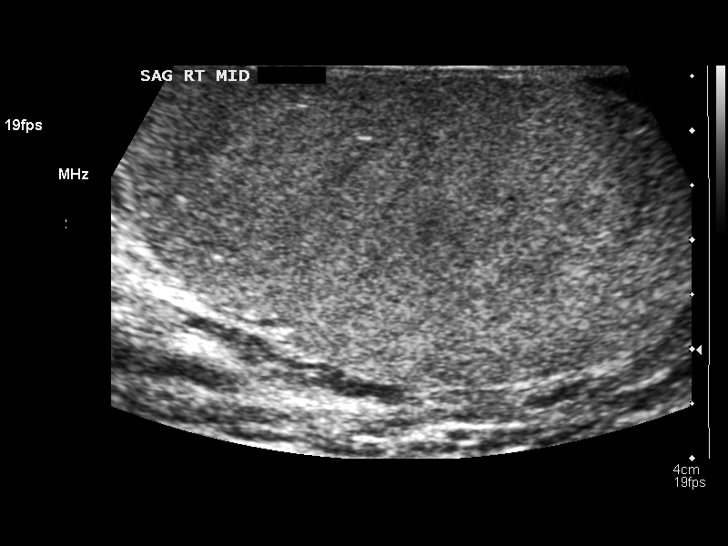
[im 22/66]
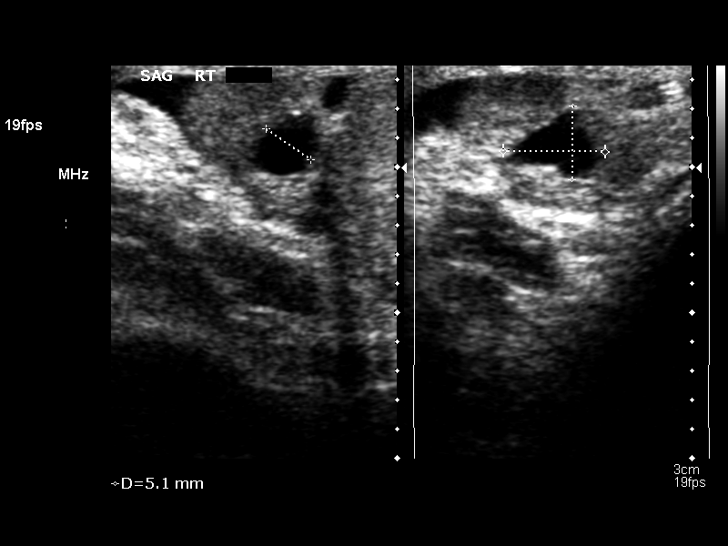
[im 25/66]
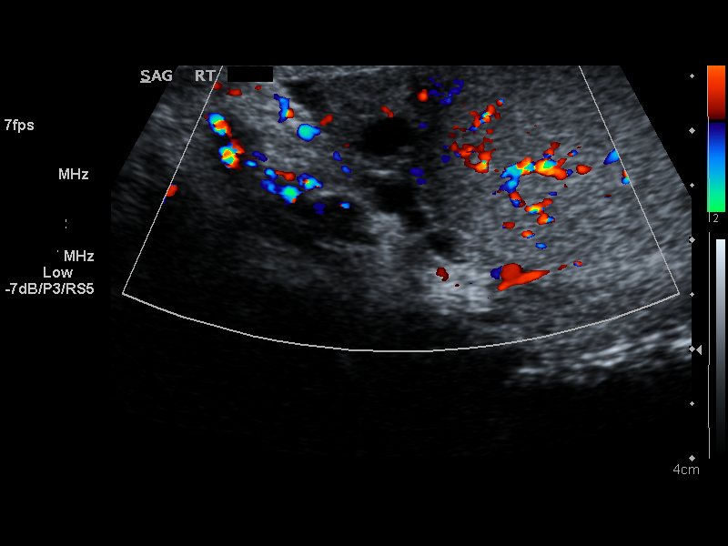
[im 30/66]
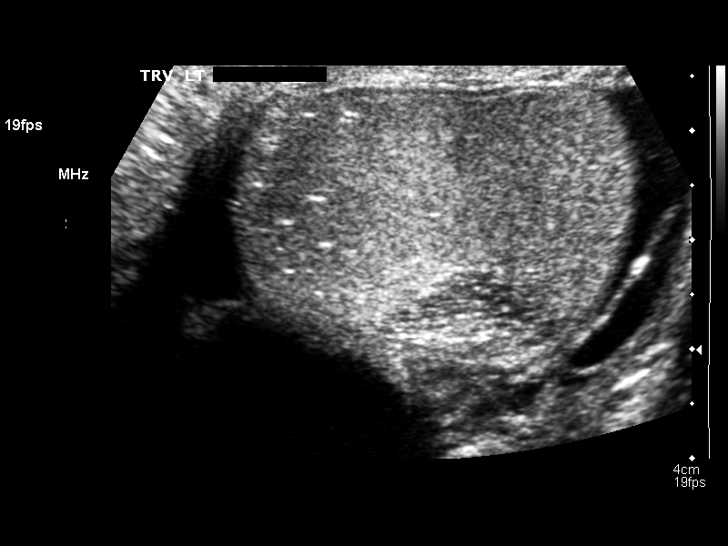
[im 36/66]
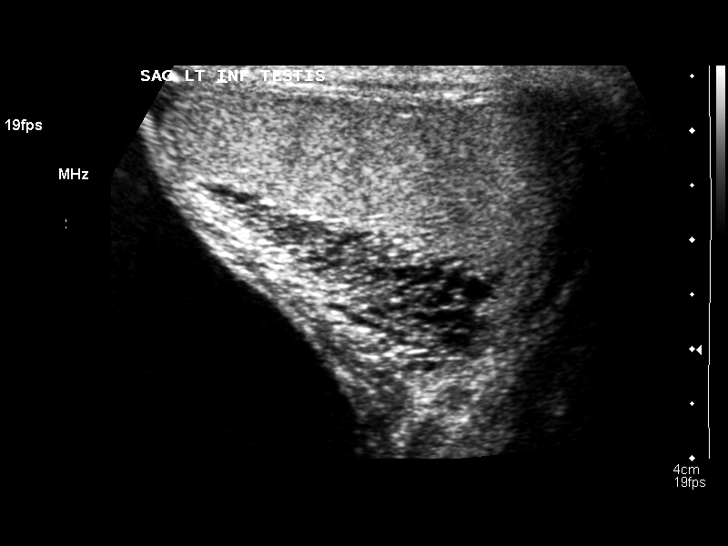
[im 41/66]
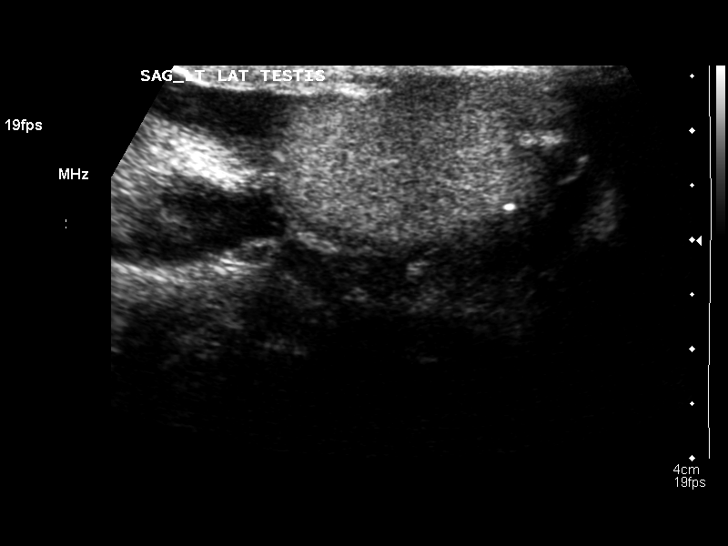
[im 44/66]
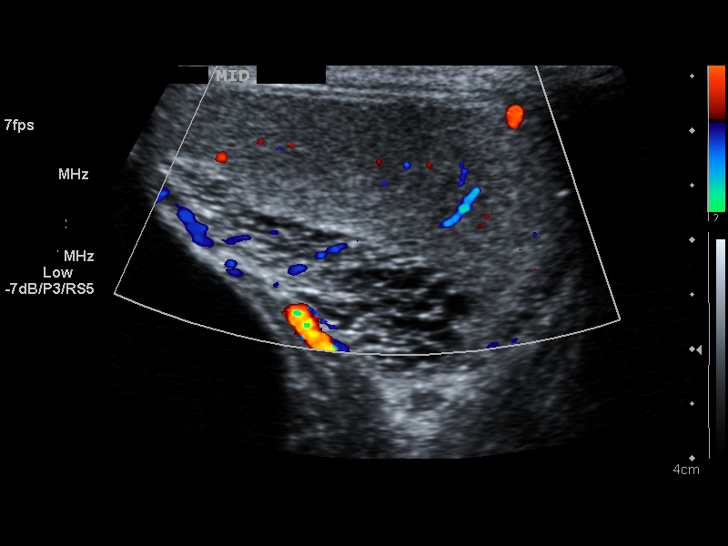
[im 49/66]
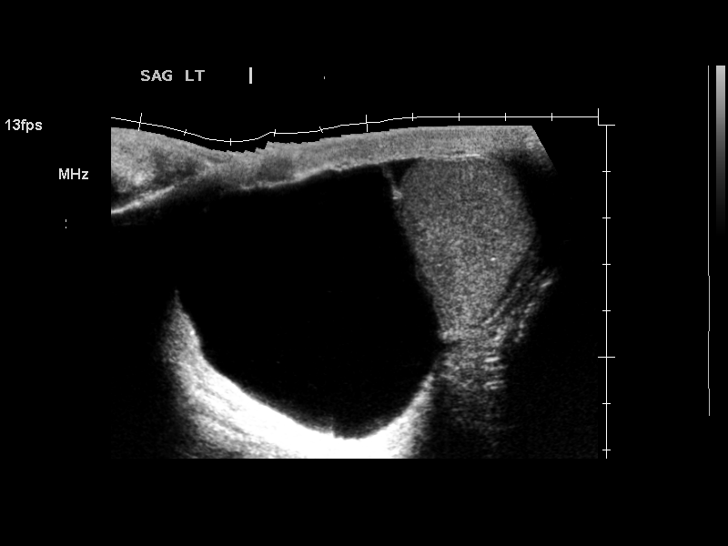
[im 55/66]
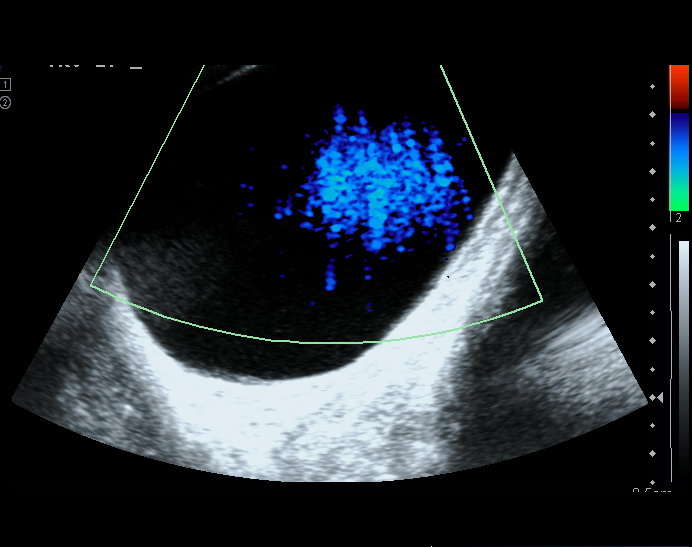
[im 60/66]
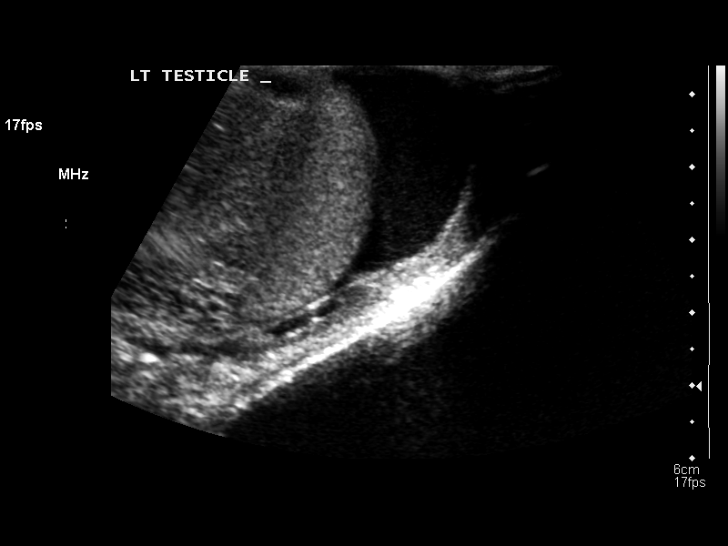
[im 66/66]
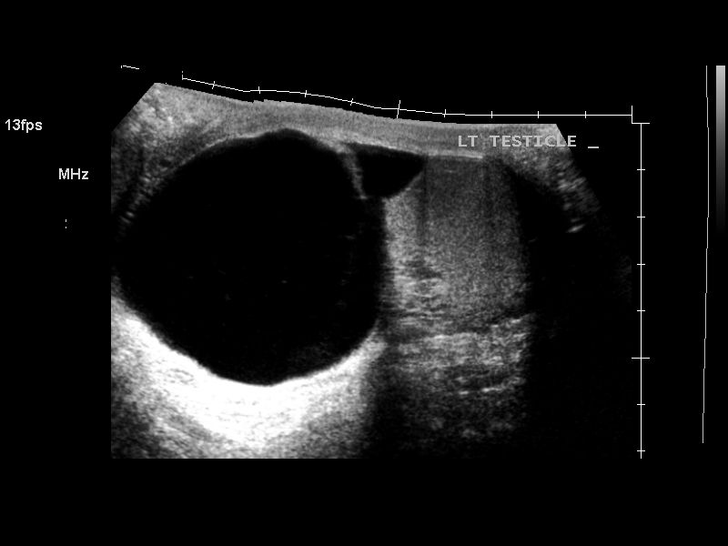

[14 of 25 positions shown; findings below may reference images not displayed]

FINDINGS: Right testis:  Measures 4.7 x 2.3 x 3.0 cm.  There are echogenic
foci medially.  Parenchymal echo texture is otherwise uniform.
Color Doppler flow is identified.

Left testis:  Measures 4.2 x 2.7 x 3.7 cm.  Echogenic foci are seen
medially.  Dilated rete testis is noted.  Parenchymal echo texture
is otherwise uniform.  Color Doppler flow is identified.

Right epididymis:  A 7 mm spermatocele or epididymal cyst is noted.

Left epididymis:  Not well visualized. Along the superomedial
aspect of the left testicle is a large anechoic lesion measuring
7.6 x 5.1 x 5.9 cm, which may be obscuring the left epididymis.

Hydrocele:  Small left hydrocele.  No right hydrocele.

Varicocele:  Absent.
IMPRESSION: 1.  Large cystic mass along the superomedial aspect of the left
testicle may represent a large epididymal cyst or spermatocele.
Left epididymis is therefore poorly visualized.
2.  Probable testicular microlithiasis.
3.  Small left hydrocele.

## 2012-06-06 ENCOUNTER — Other Ambulatory Visit: Payer: Self-pay | Admitting: *Deleted

## 2012-06-06 MED ORDER — METOPROLOL SUCCINATE ER 100 MG PO TB24: ORAL_TABLET | ORAL | Status: DC

## 2012-08-01 ENCOUNTER — Ambulatory Visit: Payer: BC Managed Care – PPO | Admitting: Cardiology

## 2012-08-02 ENCOUNTER — Other Ambulatory Visit: Payer: Self-pay | Admitting: Cardiology

## 2012-08-23 ENCOUNTER — Telehealth: Payer: Self-pay | Admitting: Cardiology

## 2012-08-23 NOTE — Telephone Encounter (Signed)
Left message to call back  

## 2012-08-23 NOTE — Telephone Encounter (Signed)
New Prob    Pt states he had an episode of afib lastnight and would like to speak to nurse regarding this. Please call.

## 2012-08-24 NOTE — Telephone Encounter (Signed)
New Prob  Pt is returning your call.

## 2012-08-24 NOTE — Telephone Encounter (Signed)
Patient was seen in the emergency room in February for Afib (no show for March follow up appt and appointment 6/24 had to rescheduled by provider). Patient had an episode that felt like his Afib that lasted 2 to 3 hours a couple of nights ago. He is going on vacation next week and has ov when he gets back. He does limit his caffeine and alcohol intake. Patient is concerned that he might have a problem next week while he is away and if he could take extra Metoprolol, current dose is Metoprolol tartrate 100 mg 1 and 1/2 twice a day. Will forward to  Dr. Patty Sermons for review

## 2012-08-24 NOTE — Telephone Encounter (Signed)
Patient verbalized understanding  

## 2012-08-24 NOTE — Telephone Encounter (Signed)
Discussed with  Dr. Patty Sermons and ok to take an extra Toprol if should happen again. If you are feeling ok with the Afib but does not subside within 4 hours you need to go to ED. If you are feeling poorly you need to go sooner than 4 hours. Keep appointment 09/04/12.

## 2012-09-01 ENCOUNTER — Other Ambulatory Visit: Payer: Self-pay | Admitting: Cardiology

## 2012-09-04 ENCOUNTER — Ambulatory Visit (INDEPENDENT_AMBULATORY_CARE_PROVIDER_SITE_OTHER): Payer: BC Managed Care – PPO | Admitting: Cardiology

## 2012-09-04 ENCOUNTER — Encounter: Payer: Self-pay | Admitting: Cardiology

## 2012-09-04 VITALS — BP 124/76 | HR 60 | Ht 74.0 in | Wt 240.4 lb

## 2012-09-04 DIAGNOSIS — I10 Essential (primary) hypertension: Secondary | ICD-10-CM

## 2012-09-04 DIAGNOSIS — I480 Paroxysmal atrial fibrillation: Secondary | ICD-10-CM

## 2012-09-04 DIAGNOSIS — I421 Obstructive hypertrophic cardiomyopathy: Secondary | ICD-10-CM

## 2012-09-04 DIAGNOSIS — R011 Cardiac murmur, unspecified: Secondary | ICD-10-CM

## 2012-09-04 DIAGNOSIS — I4891 Unspecified atrial fibrillation: Secondary | ICD-10-CM

## 2012-09-04 DIAGNOSIS — E785 Hyperlipidemia, unspecified: Secondary | ICD-10-CM

## 2012-09-04 NOTE — Assessment & Plan Note (Signed)
Blood pressure was remaining stable on current medication.  He has gained 7 pounds since last visit and has not been getting much regular intentional exercise

## 2012-09-04 NOTE — Assessment & Plan Note (Signed)
The patient has had no episodes of exertional dizziness or syncope.. no symptoms of congestive heart failure.  No angina pectoris.  If he tries to exercise shortly after eating he does experience shortness of breath and occasional dizziness.

## 2012-09-04 NOTE — Assessment & Plan Note (Signed)
The patient has had some brief palpitations but no further atrial fibrillation.

## 2012-09-04 NOTE — Progress Notes (Signed)
[name] Date of Birth:  Apr 23, 1950 [facility] [address] [place], [place]  [address] [phone]         Fax   [phone]  History of Present Illness: This pleasant 62 year old gentleman is seen after a one-year absence. He has a history of known IHSS. He does not have any history of ischemic heart disease. He had a nuclear stress test 03/30/06 which was normal. His last echocardiogram was  10/22/10 and showed an ejection fraction of 65-70% with left ventricular outflow tract obstruction. His septum measures 16 mm.  Since we last saw him the patient was also seen by a cardiologist at [facility] who concurred with his medical regimen. The patient was also invited to engage in a research study at [facility] but declined to do so. In February 2014 the patient went to the emergency room 1 evening because of an irregular pulse.  EKG on 03/10/12 confirmed atrial fibrillation.  No treatment was given and after spending several hours in the emergency room he went back into normal sinus rhythm.   Current Outpatient Prescriptions  Medication Sig Dispense Refill  . aspirin (ASPIRIN CHILDRENS) 81 MG chewable tablet Chew 81 mg by mouth daily.      . metoprolol succinate (TOPROL-XL) 100 MG 24 hr tablet TAKE 1 AND 1/2 TABLETS TWICE DAILY  90 tablet  2  . simvastatin (ZOCOR) 40 MG tablet Take 1 tablet (40 mg total) by mouth at bedtime.  30 tablet  6  . vardenafil (LEVITRA) 10 MG tablet Take 1 tablet (10 mg total) by mouth as needed.  10 tablet  0   No current facility-administered medications for this visit.    No Known Allergies  Patient Active Problem List   Diagnosis Date Noted  . Paroxysmal atrial fibrillation 09/04/2012  . Chest pain at rest 12/13/2011  . Hypertrophic obstructive cardiomyopathy   . Murmur   . OSA on CPAP   . HYPERLIPIDEMIA 01/29/2009  . HYPERTENSION 01/29/2009  . OBSTRUCTIVE SLEEP APNEA 01/23/2009  . CHRONIC RHINITIS 01/23/2009    History  Smoking  status  . Former Smoker  . Types: Cigarettes  . Quit date: 04/21/1971  Smokeless tobacco  . Not on file    History  Alcohol Use No    Family History  Problem Relation Age of Onset  . Other Father     CARDIOMEGALY  . Cancer Father   . ALS Mother   . Atrial fibrillation Brother   . Arrhythmia Sister     History of ablation and    Review of Systems: Constitutional: no fever chills diaphoresis or fatigue or change in weight.  Head and neck: no hearing loss, no epistaxis, no photophobia or visual disturbance. Respiratory: No cough, shortness of breath or wheezing. Cardiovascular: No chest pain peripheral edema, palpitations. Gastrointestinal: No abdominal distention, no abdominal pain, no change in bowel habits hematochezia or melena. Genitourinary: No dysuria, no frequency, no urgency, no nocturia. Musculoskeletal:No arthralgias, no back pain, no gait disturbance or myalgias. Neurological: No dizziness, no headaches, no numbness, no seizures, no syncope, no weakness, no tremors. Hematologic: No lymphadenopathy, no easy bruising. Psychiatric: No confusion, no hallucinations, no sleep disturbance.    Physical Exam: Filed Vitals:   09/04/12 1419  BP: 124/76  Pulse: 60   general appearance reveals a well-developed well-nourished gentleman in no distress.  He is mildly overweight.The head and neck exam reveals pupils equal and reactive.  Extraocular movements are full.  There is no scleral icterus.  The mouth and pharynx are normal.  The neck is supple.  The carotids reveal no bruits.  The jugular venous pressure is normal.  The  thyroid is not enlarged.  There is no lymphadenopathy.  The chest is clear to percussion and auscultation.  There are no rales or rhonchi.  Expansion of the chest is symmetrical.  The precordium is quiet.  The first heart sound is normal.  The second heart sound is physiologically split.  There is a grade 3/6 harsh systolic ejection murmur widely heard  across the precordium but loudest at the left sternal edge and base. There is no abnormal lift or heave.  The abdomen is soft and nontender.  The bowel sounds are normal.  The liver and spleen are not enlarged.  There are no abdominal masses.  There are no abdominal bruits.  Extremities reveal good pedal pulses.  There is no phlebitis or edema.  There is no cyanosis or clubbing.  Strength is normal and symmetrical in all extremities.  There is no lateralizing weakness.  There are no sensory deficits.  The skin is warm and dry.  There is no rash.  EKG today shows normal sinus rhythm at 60 per minute and LVH with strain pattern.  Since last tracing 03/10/12, atrial fibrillation is gone.   Assessment / Plan: We will continue his same medication.  No need yet for chronic anticoagulation other than baby aspirin.  I want him to work harder on weight loss and regular aerobic exercise.  Recheck in 6 months for office visit lipid panel hepatic function panel and basal metabolic panel.

## 2012-09-04 NOTE — Assessment & Plan Note (Signed)
The patient is on simvastatin 40 mg daily for hypercholesterolemia.  No myalgias.  We will plan to recheck his fasting lipids at his next visit in 6 months.

## 2012-09-04 NOTE — Patient Instructions (Signed)
Your physician has requested that you have an echocardiogram. Echocardiography is a painless test that uses sound waves to create images of your heart. It provides your doctor with information about the size and shape of your heart and how well your heart's chambers and valves are working. This procedure takes approximately one hour. There are no restrictions for this procedure.  Your physician wants you to follow-up in: 6 months  You will receive a reminder letter in the mail two months in advance. If you don't receive a letter, please call our office to schedule the follow-up appointment.   Your physician recommends that you return for a FASTING lipid profile: 6 months

## 2012-09-19 ENCOUNTER — Ambulatory Visit (HOSPITAL_COMMUNITY): Payer: BC Managed Care – PPO | Attending: Cardiology | Admitting: Radiology

## 2012-09-19 DIAGNOSIS — R011 Cardiac murmur, unspecified: Secondary | ICD-10-CM

## 2012-09-19 DIAGNOSIS — I421 Obstructive hypertrophic cardiomyopathy: Secondary | ICD-10-CM

## 2012-09-19 DIAGNOSIS — I480 Paroxysmal atrial fibrillation: Secondary | ICD-10-CM

## 2012-09-19 DIAGNOSIS — I4891 Unspecified atrial fibrillation: Secondary | ICD-10-CM | POA: Insufficient documentation

## 2012-09-19 DIAGNOSIS — R079 Chest pain, unspecified: Secondary | ICD-10-CM

## 2012-09-19 DIAGNOSIS — I422 Other hypertrophic cardiomyopathy: Secondary | ICD-10-CM | POA: Insufficient documentation

## 2012-09-19 NOTE — Progress Notes (Signed)
Echocardiogram performed.  

## 2012-10-05 ENCOUNTER — Other Ambulatory Visit: Payer: Self-pay | Admitting: Cardiology

## 2012-10-09 ENCOUNTER — Telehealth: Payer: Self-pay | Admitting: *Deleted

## 2012-10-09 NOTE — Telephone Encounter (Signed)
Advised of echo  

## 2012-10-09 NOTE — Telephone Encounter (Signed)
Message copied by Burnell Blanks on Mon Oct 09, 2012  6:03 PM ------      Message from: Cassell Clement      Created: Wed Sep 20, 2012 10:01 PM       Please report. The echo shows that the thickness of the Left ventricle has increased further since prior echo. We will need to continue to follow him closely and plan for another echo in about a year.Continue high dose metoprolol 150 mg BID. Work hard on losing weight by careful diet. ------

## 2012-10-19 ENCOUNTER — Telehealth: Payer: Self-pay | Admitting: Cardiology

## 2012-10-19 NOTE — Telephone Encounter (Signed)
Patient takes Simvastatin and concerned that this could be causing his pain. Advised ok to hold Simvastatin for 2 weeks and call with update. If symptoms worse or no better he needs to follow up with PCP

## 2012-10-19 NOTE — Telephone Encounter (Signed)
New Problem  Pt had a symptom that he believes is related to his medication// soreness of muscles// mainly right shoulder and upper arm.

## 2012-10-19 NOTE — Telephone Encounter (Signed)
Agree with advice given

## 2012-12-01 ENCOUNTER — Telehealth: Payer: Self-pay | Admitting: Cardiology

## 2012-12-01 DIAGNOSIS — I4891 Unspecified atrial fibrillation: Secondary | ICD-10-CM

## 2012-12-01 MED ORDER — DILTIAZEM HCL 60 MG PO TABS: ORAL_TABLET | ORAL | Status: DC

## 2012-12-01 NOTE — Telephone Encounter (Signed)
New Problem  Pt was AFIB on 10/27. PCP advised to call heartcare for medication alternatives and future ways to prevent Afib episodes/// Pt states he also needs a new password for Mychart. Please assist

## 2012-12-01 NOTE — Telephone Encounter (Signed)
Advised patient, verbalized understanding  

## 2012-12-01 NOTE — Telephone Encounter (Signed)
Having episodes about once a month. This last episode few nights ago lasted about 3 1/2 hours and was having some chest tightness. concerned with the chest tightness. Wanting to make sure it still stands that it 4 hours before he needs to go to ED. PCP suggested maybe he should not wait that long and discussing Cardizem for episodes. Will forward to  Dr. Patty Sermons for review.

## 2012-12-01 NOTE — Telephone Encounter (Signed)
Continue current medication.  We should also give him a supply of diltiazem 60 mg tablets.  He should take a diltiazem 60 mg tablet at onset of his atrial fib.  His atrial fib has not responded within 4 hours, go to the emergency room.

## 2013-01-01 ENCOUNTER — Other Ambulatory Visit: Payer: Self-pay | Admitting: *Deleted

## 2013-01-01 MED ORDER — METOPROLOL SUCCINATE ER 100 MG PO TB24: ORAL_TABLET | ORAL | Status: DC

## 2013-01-02 ENCOUNTER — Encounter (INDEPENDENT_AMBULATORY_CARE_PROVIDER_SITE_OTHER): Payer: Self-pay

## 2013-01-02 ENCOUNTER — Ambulatory Visit (INDEPENDENT_AMBULATORY_CARE_PROVIDER_SITE_OTHER): Payer: BC Managed Care – PPO | Admitting: Cardiology

## 2013-01-02 ENCOUNTER — Encounter: Payer: Self-pay | Admitting: Cardiology

## 2013-01-02 VITALS — BP 142/62 | HR 56 | Ht 74.0 in | Wt 239.0 lb

## 2013-01-02 DIAGNOSIS — I4891 Unspecified atrial fibrillation: Secondary | ICD-10-CM

## 2013-01-02 DIAGNOSIS — I480 Paroxysmal atrial fibrillation: Secondary | ICD-10-CM

## 2013-01-02 DIAGNOSIS — I421 Obstructive hypertrophic cardiomyopathy: Secondary | ICD-10-CM

## 2013-01-02 DIAGNOSIS — E785 Hyperlipidemia, unspecified: Secondary | ICD-10-CM

## 2013-01-02 DIAGNOSIS — G4733 Obstructive sleep apnea (adult) (pediatric): Secondary | ICD-10-CM

## 2013-01-02 NOTE — Assessment & Plan Note (Signed)
The patient has hyperlipidemia and is followed by Dr. Ricki Miller.  He is not having any side effects from the statin therapy

## 2013-01-02 NOTE — Patient Instructions (Addendum)
Return for fasting labs tomorrow   Your physician recommends that you continue on your current medications as directed. Please refer to the Current Medication list given to you today.  Your physician wants you to follow-up in: 6 months with fasting labs (lp/bmet/hfp)  You will receive a reminder letter in the mail two months in advance. If you don't receive a letter, please call our office to schedule the follow-up appointment.

## 2013-01-02 NOTE — Assessment & Plan Note (Signed)
The patient has not been experiencing any exertional chest pain.  He does have mild exertional dyspnea.  He has not been getting any regular exercise.  He remains overweight.  He has not had any dizziness or syncope

## 2013-01-02 NOTE — Progress Notes (Signed)
Wayne Beck Date of Birth:  Mar 28, 1950 [address] [place], [place]  [address] [phone]         Fax   [phone]  History of Present Illness: This pleasant 62 year old gentleman is seen by a scheduled followup office visit . He has a history of known IHSS. He does not have any history of ischemic heart disease. He had a nuclear stress test 03/30/06 which was normal. 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 The patient was also invited to engage in a research study at [facility] but declined to do so. In February 2014 the patient went to the emergency room 1 evening because of an irregular pulse.  EKG on 03/10/12 confirmed atrial fibrillation.  No treatment was given and after spending several hours in the emergency room he went back into normal sinus rhythm.  He has not had any recurrent atrial fibrillation.   Current Outpatient Prescriptions  Medication Sig Dispense Refill  . aspirin (ASPIRIN CHILDRENS) 81 MG chewable tablet Chew 81 mg by mouth daily.      Marland Kitchen diltiazem (CARDIZEM) 60 MG tablet 1 tablet at onset of AFib or as directed  30 tablet  1  . metoprolol succinate (TOPROL-XL) 100 MG 24 hr tablet TAKE 1 & 1/2 TABLETS BY MOUTH TWICE A DAY  270 tablet  0  . simvastatin (ZOCOR) 40 MG tablet TAKE 1 TABLET (40 MG TOTAL) BY MOUTH AT BEDTIME.  30 tablet  5  . vardenafil (LEVITRA) 10 MG tablet Take 1 tablet (10 mg total) by mouth as needed.  10 tablet  0   No current facility-administered medications for this visit.    No Known Allergies  Patient Active Problem List   Diagnosis Date Noted  . Paroxysmal atrial fibrillation 09/04/2012  . Chest pain at rest 12/13/2011  . Hypertrophic obstructive cardiomyopathy   . Murmur    . OSA on CPAP   . HYPERLIPIDEMIA 01/29/2009  . HYPERTENSION 01/29/2009  . OBSTRUCTIVE SLEEP APNEA 01/23/2009  . CHRONIC RHINITIS 01/23/2009    History  Smoking status  . Former Smoker  . Types: Cigarettes  . Quit date: 04/21/1971  Smokeless tobacco  . Not on file    History  Alcohol Use No    Family History  Problem Relation Age of Onset  . Other Father     CARDIOMEGALY  . Cancer Father   . ALS Mother   . Atrial fibrillation Brother   . Arrhythmia Sister     History of ablation and    Review of Systems: Constitutional: no fever chills diaphoresis or fatigue or change in weight.  Head and neck: no hearing loss, no epistaxis, no photophobia or visual disturbance. Respiratory: No cough, shortness of breath or wheezing. Cardiovascular: No chest pain peripheral edema, palpitations. Gastrointestinal: No abdominal distention, no abdominal pain, no change in bowel habits hematochezia or melena. Genitourinary: No dysuria, no frequency, no urgency, no nocturia. Musculoskeletal:No arthralgias, no back pain, no gait disturbance or myalgias. Neurological: No dizziness, no headaches, no numbness, no seizures, no syncope, no weakness, no tremors. Hematologic: No lymphadenopathy, no easy bruising. Psychiatric: No confusion, no hallucinations, no sleep disturbance.    Physical Exam: Filed Vitals:   01/02/13 1151  BP: 142/62  Pulse: 56   general  appearance reveals a well-developed well-nourished gentleman in no distress.  He is mildly overweight.The head and neck exam reveals pupils equal and reactive.  Extraocular movements are full.  There is no scleral icterus.  The mouth and pharynx are normal.  The neck is supple.  The carotids reveal no bruits.  The jugular venous pressure is normal.  The  thyroid is not enlarged.  There is no lymphadenopathy.  The chest is clear to percussion and auscultation.  There are no rales or rhonchi.  Expansion of the chest is symmetrical.  The  precordium is quiet.  The first heart sound is normal.  The second heart sound is physiologically split.  There is a grade 3/6 harsh systolic ejection murmur widely heard across the precordium but loudest at the left sternal edge and base. There is no abnormal lift or heave.  The abdomen is soft and nontender.  The bowel sounds are normal.  The liver and spleen are not enlarged.  There are no abdominal masses.  There are no abdominal bruits.  Extremities reveal good pedal pulses.  There is no phlebitis or edema.  There is no cyanosis or clubbing.  Strength is normal and symmetrical in all extremities.  There is no lateralizing weakness.  There are no sensory deficits.  The skin is warm and dry.  There is no rash.  EKG today shows normal sinus rhythm at 60 per minute and LVH with strain pattern.  Since last tracing 03/10/12, atrial fibrillation is gone.   Assessment / Plan: We will continue his same medication.  No need yet for chronic anticoagulation other than baby aspirin.  I want him to work harder on weight loss and regular aerobic exercise.  Recheck in 6 months for office visit lipid panel hepatic function panel and basal metabolic panel.     Wayne Beck Date of Birth:  03/15/1950 [facility] [address] [place], [place]  [address] [phone]         Fax   [phone]  History of Present Illness: This pleasant 62 year old gentleman is seen after a one-year absence. He has a history of known IHSS. He does not have any history of ischemic heart disease. He had a nuclear stress test 03/30/06 which was normal. His last echocardiogram was  10/22/10 and showed an ejection fraction of 65-70% with left ventricular outflow tract obstruction. His septum measures 16 mm.  Since we last saw him the patient was also seen by a cardiologist at [facility] who concurred with his medical regimen. The patient was also invited to engage in a research study at [facility] but declined to do so. In  February 2014 the patient went to the emergency room 1 evening because of an irregular pulse.  EKG on 03/10/12 confirmed atrial fibrillation.  No treatment was given and after spending several hours in the emergency room he went back into normal sinus rhythm.   Current Outpatient Prescriptions  Medication Sig Dispense Refill  . aspirin (ASPIRIN CHILDRENS) 81 MG chewable tablet Chew 81 mg by mouth daily.      Marland Kitchen diltiazem (CARDIZEM) 60 MG tablet 1 tablet at onset of AFib or as directed  30 tablet  1  . metoprolol succinate (TOPROL-XL) 100 MG 24 hr tablet TAKE 1 & 1/2 TABLETS BY MOUTH TWICE A DAY  270 tablet  0  . simvastatin (ZOCOR) 40 MG tablet TAKE 1 TABLET (40 MG TOTAL) BY MOUTH AT BEDTIME.  30 tablet  5  . vardenafil (LEVITRA)  10 MG tablet Take 1 tablet (10 mg total) by mouth as needed.  10 tablet  0   No current facility-administered medications for this visit.    No Known Allergies  Patient Active Problem List   Diagnosis Date Noted  . Paroxysmal atrial fibrillation 09/04/2012  . Chest pain at rest 12/13/2011  . Hypertrophic obstructive cardiomyopathy   . Murmur   . OSA on CPAP   . HYPERLIPIDEMIA 01/29/2009  . HYPERTENSION 01/29/2009  . OBSTRUCTIVE SLEEP APNEA 01/23/2009  . CHRONIC RHINITIS 01/23/2009    History  Smoking status  . Former Smoker  . Types: Cigarettes  . Quit date: 04/21/1971  Smokeless tobacco  . Not on file    History  Alcohol Use No    Family History  Problem Relation Age of Onset  . Other Father     CARDIOMEGALY  . Cancer Father   . ALS Mother   . Atrial fibrillation Brother   . Arrhythmia Sister     History of ablation and    Review of Systems: Constitutional: no fever chills diaphoresis or fatigue or change in weight.  Head and neck: no hearing loss, no epistaxis, no photophobia or visual disturbance. Respiratory: No cough, shortness of breath or wheezing. Cardiovascular: No chest pain peripheral edema, palpitations. Gastrointestinal: No  abdominal distention, no abdominal pain, no change in bowel habits hematochezia or melena. Genitourinary: No dysuria, no frequency, no urgency, no nocturia. Musculoskeletal:No arthralgias, no back pain, no gait disturbance or myalgias. Neurological: No dizziness, no headaches, no numbness, no seizures, no syncope, no weakness, no tremors. Hematologic: No lymphadenopathy, no easy bruising. Psychiatric: No confusion, no hallucinations, no sleep disturbance.    Physical Exam: Filed Vitals:   01/02/13 1151  BP: 142/62  Pulse: 56   general appearance reveals a well-developed well-nourished gentleman in no distress.  He is mildly overweight.The head and neck exam reveals pupils equal and reactive.  Extraocular movements are full.  There is no scleral icterus.  The mouth and pharynx are normal.  The neck is supple.  The carotids reveal no bruits.  The jugular venous pressure is normal.  The  thyroid is not enlarged.  There is no lymphadenopathy.  The chest is clear to percussion and auscultation.  There are no rales or rhonchi.  Expansion of the chest is symmetrical.  The precordium is quiet.  The first heart sound is normal.  The second heart sound is physiologically split.  There is a grade 3/6 harsh systolic ejection murmur widely heard across the precordium but loudest at the left sternal edge and base. There is no abnormal lift or heave.  The abdomen is soft and nontender.  The bowel sounds are normal.  The liver and spleen are not enlarged.  There are no abdominal masses.  There are no abdominal bruits.  Extremities reveal good pedal pulses.  There is no phlebitis or edema.  There is no cyanosis or clubbing.  Strength is normal and symmetrical in all extremities.  There is no lateralizing weakness.  There are no sensory deficits.  The skin is warm and dry.  There is no rash.    Assessment / Plan: We will continue his same medication.  No need yet for chronic anticoagulation other than baby aspirin.   I want him to work harder on weight loss and regular aerobic exercise.  He has an exercise bike at home and he plans to start using it.  The patient will return soon for fasting lipid panel.  He was not fasting today  Recheck in 6 months for office visit and EKG.  Work harder on weight loss. ne

## 2013-01-02 NOTE — Assessment & Plan Note (Signed)
No further atrial fibrillation.  He does have diltiazem on hand for when necessary use

## 2013-01-03 ENCOUNTER — Telehealth: Payer: Self-pay | Admitting: *Deleted

## 2013-01-03 ENCOUNTER — Other Ambulatory Visit (INDEPENDENT_AMBULATORY_CARE_PROVIDER_SITE_OTHER): Payer: BC Managed Care – PPO

## 2013-01-03 ENCOUNTER — Other Ambulatory Visit: Payer: BC Managed Care – PPO

## 2013-01-03 DIAGNOSIS — E785 Hyperlipidemia, unspecified: Secondary | ICD-10-CM

## 2013-01-03 LAB — BASIC METABOLIC PANEL
BUN: 12 mg/dL (ref 6–23)
CO2: 27 mEq/L (ref 19–32)
Calcium: 9.1 mg/dL (ref 8.4–10.5)
Chloride: 106 mEq/L (ref 96–112)
Creatinine, Ser: 1.1 mg/dL (ref 0.4–1.5)

## 2013-01-03 LAB — HEPATIC FUNCTION PANEL
AST: 21 U/L (ref 0–37)
Albumin: 3.9 g/dL (ref 3.5–5.2)
Alkaline Phosphatase: 57 U/L (ref 39–117)
Bilirubin, Direct: 0.1 mg/dL (ref 0.0–0.3)
Total Protein: 6.9 g/dL (ref 6.0–8.3)

## 2013-01-03 LAB — LIPID PANEL
Cholesterol: 174 mg/dL (ref 0–200)
Total CHOL/HDL Ratio: 5
VLDL: 40.2 mg/dL — ABNORMAL HIGH (ref 0.0–40.0)

## 2013-01-03 LAB — LDL CHOLESTEROL, DIRECT: Direct LDL: 121.4 mg/dL

## 2013-01-03 NOTE — Telephone Encounter (Signed)
Advised patient of lab results  

## 2013-01-03 NOTE — Telephone Encounter (Signed)
Message copied by Burnell Blanks on Wed Jan 03, 2013  5:16 PM ------      Message from: Cassell Clement      Created: Wed Jan 03, 2013  2:23 PM       Please report. Lab okay except BS and TG are still high related to his weight.  Watch diet better. LDL still high. Lose weight. Continue simvastatin. LFTs good. ------

## 2013-01-16 ENCOUNTER — Encounter: Payer: Self-pay | Admitting: Cardiology

## 2013-03-15 ENCOUNTER — Telehealth: Payer: Self-pay | Admitting: *Deleted

## 2013-03-15 NOTE — Telephone Encounter (Signed)
[employer] approved levitra since patient has tried cialis and viagra in past

## 2013-05-01 ENCOUNTER — Other Ambulatory Visit: Payer: Self-pay

## 2013-05-01 MED ORDER — METOPROLOL SUCCINATE ER 100 MG PO TB24: ORAL_TABLET | ORAL | Status: DC

## 2013-07-06 ENCOUNTER — Ambulatory Visit: Payer: BC Managed Care – PPO | Admitting: Cardiology

## 2013-08-08 ENCOUNTER — Encounter: Payer: Self-pay | Admitting: Cardiology

## 2013-08-08 ENCOUNTER — Ambulatory Visit (INDEPENDENT_AMBULATORY_CARE_PROVIDER_SITE_OTHER): Payer: BC Managed Care – PPO | Admitting: Cardiology

## 2013-08-08 VITALS — BP 145/92 | HR 52 | Ht 74.0 in | Wt 241.0 lb

## 2013-08-08 DIAGNOSIS — E7800 Pure hypercholesterolemia, unspecified: Secondary | ICD-10-CM

## 2013-08-08 DIAGNOSIS — E785 Hyperlipidemia, unspecified: Secondary | ICD-10-CM

## 2013-08-08 DIAGNOSIS — I10 Essential (primary) hypertension: Secondary | ICD-10-CM

## 2013-08-08 DIAGNOSIS — I421 Obstructive hypertrophic cardiomyopathy: Secondary | ICD-10-CM

## 2013-08-08 DIAGNOSIS — I4891 Unspecified atrial fibrillation: Secondary | ICD-10-CM

## 2013-08-08 DIAGNOSIS — I480 Paroxysmal atrial fibrillation: Secondary | ICD-10-CM

## 2013-08-08 MED ORDER — SIMVASTATIN 40 MG PO TABS: ORAL_TABLET | ORAL | Status: DC

## 2013-08-08 NOTE — Patient Instructions (Addendum)
RESTART SIMVASTATIN 40 MG DAILY, RX SENT TO PHARMACY   Work harder on weight loss and diet  Return after September 19, 2013 for echo and fasting labs (lp/bmet/hfp) Your physician has requested that you have an echocardiogram. Echocardiography is a painless test that uses sound waves to create images of your heart. It provides your doctor with information about the size and shape of your heart and how well your heart's chambers and valves are working. This procedure takes approximately one hour. There are no restrictions for this procedure.  Your physician recommends that you continue on your current medications as directed. Please refer to the Current Medication list given to you today.  Your physician wants you to follow-up in: 6 month ov You will receive a reminder letter in the mail two months in advance. If you don't receive a letter, please call our office to schedule the follow-up appointment.

## 2013-08-08 NOTE — Assessment & Plan Note (Signed)
The patient recently ran out of his simvastatin.  We will refill his simvastatin.

## 2013-08-08 NOTE — Progress Notes (Signed)
Wayne HockeyWilliam M Beck Date of Birth:  February 15, 1950 [facility] [address] [place], [place]  [id number] [phone]        Fax   [phone]   History of Present Illness: This pleasant 63 year old gentleman is seen by a scheduled followup office visit . He has a history of known HOCM. He does not have any history of ischemic heart disease. He had a nuclear stress test 03/30/06 which was normal. 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 The patient was also [facility] but declined to do so.  In February 2014 the patient went to the emergency room 1 evening because of an irregular pulse. EKG on 03/10/12 confirmed atrial fibrillation. No treatment was given and after spending several hours in the emergency room he went back into normal sinus rhythm. He has not had any recurrent atrial fibrillation. The patient has a history of sleep apnea and is considering having surgery for this by ENT.   Current Outpatient Prescriptions  Medication Sig Dispense Refill  . aspirin (ASPIRIN CHILDRENS) 81 MG chewable tablet Chew 81 mg by mouth daily.      Marland Kitchen. diltiazem (CARDIZEM) 60 MG tablet 1 tablet at onset of AFib or as directed  30 tablet  1  . metoprolol succinate (TOPROL-XL) 100 MG 24 hr tablet TAKE 1 & 1/2 TABLETS BY MOUTH TWICE A DAY  270 tablet  6  . simvastatin (ZOCOR) 40 MG tablet TAKE 1 TABLET (40 MG TOTAL) BY MOUTH AT BEDTIME.  30 tablet  5  . vardenafil (LEVITRA) 10 MG tablet Take 1 tablet (10 mg total) by mouth as needed.  10 tablet  0   No current facility-administered medications for this visit.    No Known Allergies  Patient Active Problem List   Diagnosis Date Noted  . Paroxysmal atrial  fibrillation 09/04/2012  . Chest pain at rest 12/13/2011  . Hypertrophic obstructive cardiomyopathy   . Murmur   . OSA on CPAP   . HYPERLIPIDEMIA 01/29/2009  . HYPERTENSION 01/29/2009  . OBSTRUCTIVE SLEEP APNEA 01/23/2009  . CHRONIC RHINITIS 01/23/2009    History  Smoking status  . Former Smoker  . Types: Cigarettes  . Quit date: 04/21/1971  Smokeless tobacco  . Not on file    History  Alcohol Use No    Family History  Problem Relation Age of Onset  . Other Father     CARDIOMEGALY  . Cancer Father   . ALS Mother   . Atrial fibrillation Brother   . Arrhythmia Sister     History of ablation and    Review of Systems: Constitutional: no fever chills diaphoresis or fatigue or change in weight.  Head and neck: no hearing loss, no epistaxis, no photophobia or visual disturbance. Respiratory: No cough, shortness of breath or wheezing. Cardiovascular: No chest pain peripheral edema, palpitations. Gastrointestinal: No abdominal distention, no abdominal pain, no change in bowel habits hematochezia or melena. Genitourinary: No dysuria, no frequency, no urgency, no nocturia. Musculoskeletal:No arthralgias, no back pain, no gait disturbance or myalgias. Neurological: No dizziness, no headaches, no numbness, no seizures, no syncope, no weakness, no tremors. Hematologic: No lymphadenopathy, no easy bruising. Psychiatric: No confusion, no hallucinations, no sleep  disturbance.    Physical Exam: Filed Vitals:   08/08/13 1401  BP: 145/92  Pulse: 52   the general appearance reveals a well-developed well-nourished gentleman in no distress.The head and neck exam reveals pupils equal and reactive.  Extraocular movements are full.  There is no scleral icterus.  The mouth and pharynx are normal.  The neck is supple.  The carotids reveal no bruits.  The jugular venous pressure is normal.  The  thyroid is not enlarged.  There is no lymphadenopathy.  The chest is clear to percussion and  auscultation.  There are no rales or rhonchi.  Expansion of the chest is symmetrical.  The precordium is quiet.  The first heart sound is normal.  The second heart sound is physiologically split.  There is grade 2/6 harsh systolic ejection murmur at left sternal edge and base.  No diastolic murmur.  There is no abnormal lift or heave.  The abdomen is soft and nontender.  The bowel sounds are normal.  The liver and spleen are not enlarged.  There are no abdominal masses.  There are no abdominal bruits.  Extremities reveal good pedal pulses.  There is no phlebitis or edema.  There is no cyanosis or clubbing.  Strength is normal and symmetrical in all extremities.  There is no lateralizing weakness.  There are no sensory deficits.  The skin is warm and dry.  There is no rash.  EKG shows sinus bradycardia and marked T-wave abnormalities which have increased slightly since 09/04/12   Assessment / Plan: 1. hypertrophic obstructive cardiomyopathy 2. Hypercholesterolemia 3. 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 4. sleep apnea.  Patient is considering ENT surgery.  Continue current dose of metoprolol. Restart simvastatin 40 mg daily Retire in late August for 2-D echocardiogram and fasting lab work. Recheck in 6 months for office visit

## 2013-08-08 NOTE — Assessment & Plan Note (Signed)
The patient has not been experiencing any chest pain or angina.  He [place].

## 2013-08-08 NOTE — Assessment & Plan Note (Signed)
Blood pressure was remaining stable on current therapy.  No headaches or dizziness

## 2013-08-08 NOTE — Assessment & Plan Note (Signed)
He has had no further atrial fibrillation

## 2013-08-16 IMAGING — CR DG CHEST 1V PORT
2 series · 2 of 2 positions shown · non-contrast
Comparison: None.

CLINICAL DATA: Chest pain

PORTABLE CHEST - 1 VIEW

[AP (1 of 2)]
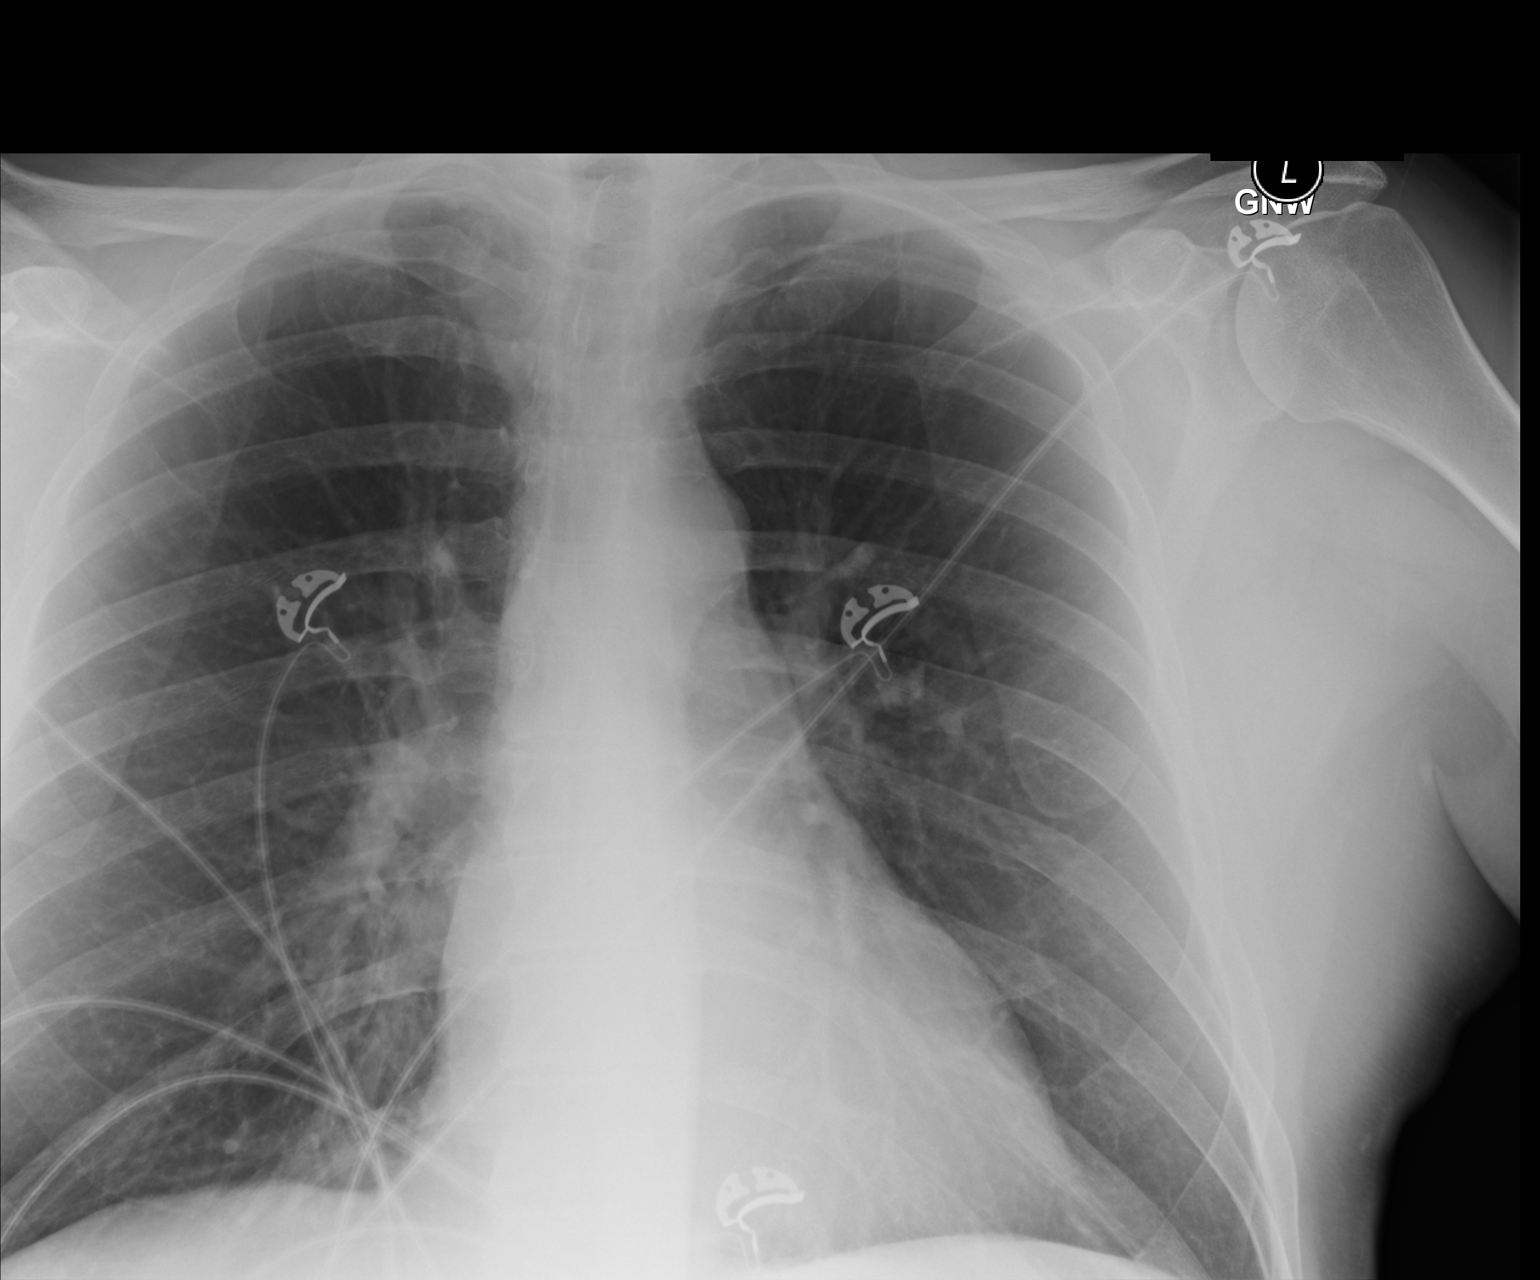

[AP (2 of 2)]
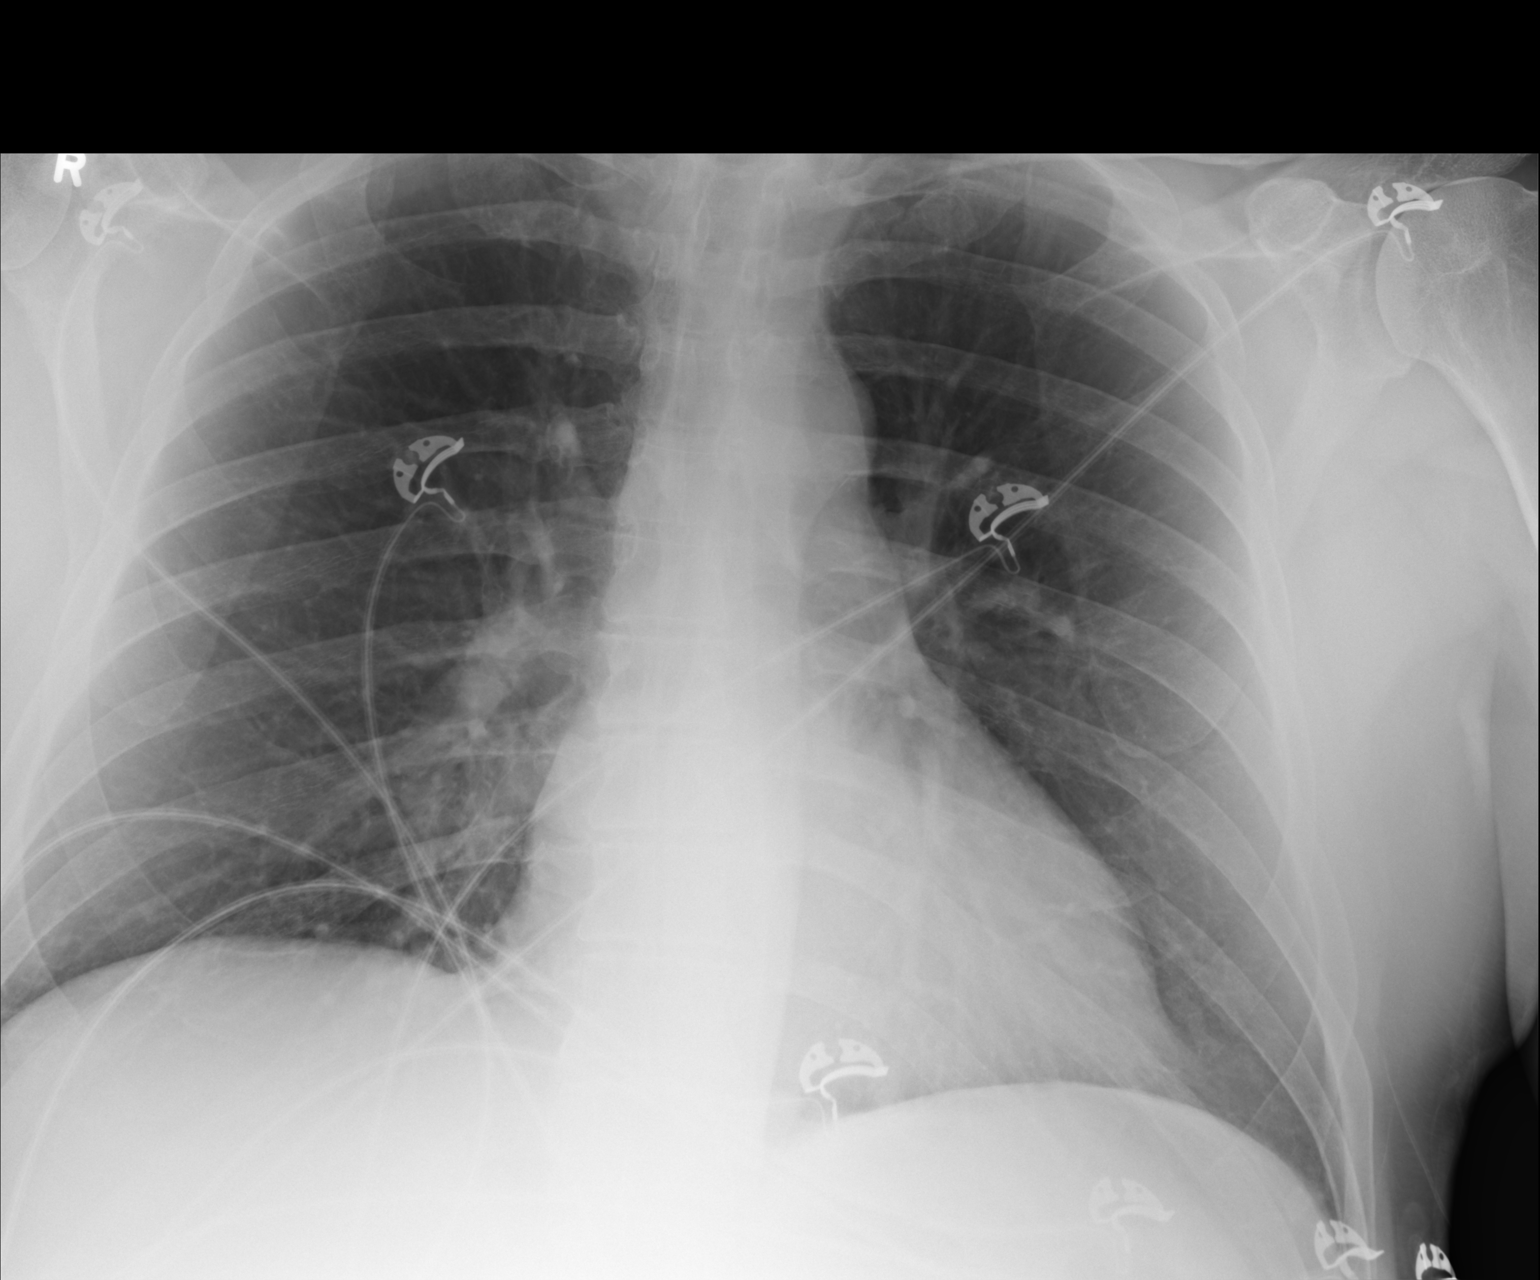

[2 of 2 positions shown; findings below may reference images not displayed]

FINDINGS: Lungs are clear. No pleural effusion or pneumothorax.

Cardiomediastinal silhouette is within normal limits.
IMPRESSION: No evidence of acute cardiopulmonary disease.

## 2013-09-20 ENCOUNTER — Other Ambulatory Visit (HOSPITAL_COMMUNITY): Payer: BC Managed Care – PPO

## 2013-09-20 ENCOUNTER — Other Ambulatory Visit: Payer: BC Managed Care – PPO

## 2013-10-03 ENCOUNTER — Ambulatory Visit (HOSPITAL_COMMUNITY): Payer: BC Managed Care – PPO | Attending: Internal Medicine | Admitting: Cardiology

## 2013-10-03 ENCOUNTER — Other Ambulatory Visit: Payer: BC Managed Care – PPO

## 2013-10-03 DIAGNOSIS — I517 Cardiomegaly: Secondary | ICD-10-CM | POA: Diagnosis not present

## 2013-10-03 DIAGNOSIS — I059 Rheumatic mitral valve disease, unspecified: Secondary | ICD-10-CM | POA: Diagnosis not present

## 2013-10-03 DIAGNOSIS — I10 Essential (primary) hypertension: Secondary | ICD-10-CM | POA: Diagnosis not present

## 2013-10-03 DIAGNOSIS — E785 Hyperlipidemia, unspecified: Secondary | ICD-10-CM | POA: Insufficient documentation

## 2013-10-03 DIAGNOSIS — Z87891 Personal history of nicotine dependence: Secondary | ICD-10-CM | POA: Diagnosis not present

## 2013-10-03 DIAGNOSIS — I421 Obstructive hypertrophic cardiomyopathy: Secondary | ICD-10-CM | POA: Insufficient documentation

## 2013-10-03 DIAGNOSIS — E669 Obesity, unspecified: Secondary | ICD-10-CM | POA: Diagnosis not present

## 2013-10-03 NOTE — Progress Notes (Signed)
Echo performed. 

## 2014-01-15 ENCOUNTER — Encounter: Payer: Self-pay | Admitting: Cardiology

## 2014-01-15 DIAGNOSIS — Z125 Encounter for screening for malignant neoplasm of prostate: Secondary | ICD-10-CM | POA: Diagnosis not present

## 2014-01-15 DIAGNOSIS — E780 Pure hypercholesterolemia: Secondary | ICD-10-CM | POA: Diagnosis not present

## 2014-01-21 DIAGNOSIS — E6609 Other obesity due to excess calories: Secondary | ICD-10-CM | POA: Diagnosis not present

## 2014-01-21 DIAGNOSIS — H6123 Impacted cerumen, bilateral: Secondary | ICD-10-CM | POA: Diagnosis not present

## 2014-01-21 DIAGNOSIS — E780 Pure hypercholesterolemia: Secondary | ICD-10-CM | POA: Diagnosis not present

## 2014-01-21 DIAGNOSIS — Z0001 Encounter for general adult medical examination with abnormal findings: Secondary | ICD-10-CM | POA: Diagnosis not present

## 2014-05-22 ENCOUNTER — Other Ambulatory Visit: Payer: Self-pay | Admitting: Cardiology

## 2014-06-23 ENCOUNTER — Other Ambulatory Visit: Payer: Self-pay | Admitting: Cardiology

## 2014-08-26 ENCOUNTER — Other Ambulatory Visit: Payer: Self-pay | Admitting: Cardiology

## 2014-09-22 ENCOUNTER — Other Ambulatory Visit: Payer: Self-pay | Admitting: Cardiology

## 2014-11-02 ENCOUNTER — Other Ambulatory Visit: Payer: Self-pay | Admitting: Cardiology

## 2014-11-21 ENCOUNTER — Other Ambulatory Visit: Payer: Self-pay | Admitting: Cardiology

## 2014-12-23 ENCOUNTER — Encounter: Payer: Self-pay | Admitting: Cardiology

## 2015-01-10 ENCOUNTER — Ambulatory Visit: Payer: Self-pay | Admitting: Cardiology

## 2015-02-23 ENCOUNTER — Other Ambulatory Visit: Payer: Self-pay | Admitting: Cardiology

## 2015-03-03 ENCOUNTER — Encounter: Payer: Self-pay | Admitting: Cardiology

## 2015-03-03 ENCOUNTER — Ambulatory Visit (INDEPENDENT_AMBULATORY_CARE_PROVIDER_SITE_OTHER): Payer: Medicare Other | Admitting: Cardiology

## 2015-03-03 VITALS — BP 146/74 | HR 50 | Ht 74.0 in | Wt 235.8 lb

## 2015-03-03 DIAGNOSIS — I421 Obstructive hypertrophic cardiomyopathy: Secondary | ICD-10-CM | POA: Diagnosis not present

## 2015-03-03 DIAGNOSIS — E7800 Pure hypercholesterolemia, unspecified: Secondary | ICD-10-CM

## 2015-03-03 DIAGNOSIS — I480 Paroxysmal atrial fibrillation: Secondary | ICD-10-CM | POA: Diagnosis not present

## 2015-03-03 NOTE — Patient Instructions (Signed)
Medication Instructions: Your physician recommends that you continue on your current medications as directed. Please refer to the Current Medication list given to you today.  Labwork: none  Testing/Procedures: none  Follow-Up: Your physician wants you to follow-up in: 6 month ov with Dr Croitoru  You will receive a reminder letter in the mail two months in advance. If you don't receive a letter, please call our office to schedule the follow-up appointment.  If you need a refill on your cardiac medications before your next appointment, please call your pharmacy.  

## 2015-03-03 NOTE — Progress Notes (Signed)
Cardiology Office Note   Date:  03/03/2015   ID:  Wayne Beck, Wayne Beck Apr 29, 1950, MRN [id number]  PCP:  No PCP Per Patient  Cardiologist: Cassell Clement MD  Chief Complaint  Patient presents with  . routine follow up    denies sob, le edema, or claudication  . Chest Pain      History of Present Illness: Wayne Beck is a 65 y.o. male who presents for follow-up visit.  We had not seen him for about a year and a half.   He has a history of known HOCM. He does not have any history of ischemic heart disease. He had a nuclear stress test 03/30/06 which was normal. His last echocardiogram was  On 09/19/12 and showed severe asymmetric septal hypertrophy with dynamic outflow obstruction and an ejection fraction of 70% and grade 1 diastolic dysfunction  Previously the patient has also been seen by a cardiologist at [facility] who concurred with his medical regimen. The patient was also invited to engage in a research study at [facility] but declined to do so.  In February 2014 the patient went to the emergency room 1 evening because of an irregular pulse. EKG on 03/10/12 confirmed atrial fibrillation. No treatment was given and after spending several hours in the emergency room he went back into normal sinus rhythm.  He has diltiazem on hand for when necessary use.  He will occasionally have brief episodes of palpitations but nothing sustained. The patient has a history of sleep apnea and is considering having surgery for this by ENT.  Currently he uses a CPAP machine.  His daughter had surgery for sleep apnea and had excellent results. The patient has not been experiencing any significant chest discomfort.  Occasionally he will have some chest pressure in the tries to walk for exercise shortly after eating a big meal.  The patient has not been having any dizzy spells or syncope. The patient has a history of hypercholesterolemia but has not been taking his simvastatin regularly because of joint pain.   He has not had his cholesterol checked recently because his PCP moved away and he is in the process of getting a new PCP who will be checking his cholesterol.  Past Medical History  Diagnosis Date  . Hypercholesteremia   . HTN (hypertension)   . Hypertrophic obstructive cardiomyopathy   . OSA on CPAP   . Erectile dysfunction     No past surgical history on file.   Current Outpatient Prescriptions  Medication Sig Dispense Refill  . aspirin (ASPIRIN CHILDRENS) 81 MG chewable tablet Chew 81 mg by mouth daily.    Marland Kitchen diltiazem (CARDIZEM) 60 MG tablet 1 tablet at onset of AFib or as directed 30 tablet 1  . metoprolol succinate (TOPROL-XL) 100 MG 24 hr tablet TAKE 1 & 1/2 TABLETS BY MOUTH TWICE A DAY 30 tablet 0  . simvastatin (ZOCOR) 40 MG tablet TAKE 1 TABLET BY MOUTH AT BEDTIME *PT NEEDS OFFICE VISIT** 7 tablet 0  . vardenafil (LEVITRA) 10 MG tablet Take 10 mg by mouth daily as needed for erectile dysfunction.     No current facility-administered medications for this visit.    Allergies:   Review of patient's allergies indicates no known allergies.    Social History:  The patient  reports that he quit smoking about 43 years ago. His smoking use included Cigarettes. He does not have any smokeless tobacco history on file. He reports that he does not drink alcohol or  use illicit drugs.   Family History:  The patient's family history includes ALS in his mother; Arrhythmia in his sister; Atrial fibrillation in his brother; Cancer in his father; Other in his father.    ROS:  Please see the history of present illness.   Otherwise, review of systems are positive for none.   All other systems are reviewed and negative.    PHYSICAL EXAM: VS:  BP 146/74 mmHg  Pulse 50  Ht  (1.88 m)  Wt 235 lb 12.8 oz (106.958 kg)  BMI 30.26 kg/m2 , BMI Body mass index is 30.26 kg/(m^2). GEN: Well nourished, well developed, in no acute distress HEENT: normal Neck: no JVD, carotid bruits, or  masses Cardiac: RRR; grade 2/6 harsh systolic ejection murmur at the base. No rubs, or gallops,no edema  Respiratory:  clear to auscultation bilaterally, normal work of breathing GI: soft, nontender, nondistended, + BS MS: no deformity or atrophy Skin: warm and dry, no rash Neuro:  Strength and sensation are intact Psych: euthymic mood, full affect   EKG:  EKG is ordered today. The ekg ordered today demonstrates marked sinus bradycardia at 49 bpm.  ST and T-wave abnormalities consistent with lateral ischemia, improved since previous EKG on 08/08/13   Recent Labs: No results found for requested labs within last 365 days.    Lipid Panel    Component Value Date/Time   CHOL 174 01/03/2013 1015   TRIG 201.0* 01/03/2013 1015   HDL 34.60* 01/03/2013 1015   CHOLHDL 5 01/03/2013 1015   VLDL 40.2* 01/03/2013 1015   LDLCALC 82 12/08/2010 0847   LDLDIRECT 121.4 01/03/2013 1015      Wt Readings from Last 3 Encounters:  03/03/15 235 lb 12.8 oz (106.958 kg)  08/08/13 241 lb (109.317 kg)  01/02/13 239 lb (108.41 kg)      Other studies Reviewed: Additional studies/ records that were reviewed today include: Echocardiogram on 10/03/13 Review of the above records demonstrates:   - Left ventricle: The cavity size was normal. Wall thickness was increased in a pattern of moderate LVH. There was severe focal basal hypertrophy of the septum. Systolic function was normal. The estimated ejection fraction was in the range of 60% to 65%. Wall motion was normal; there were no regional wall motion abnormalities. Mild LVOT obstruction. There is a 10-15 mmHg rest gradient that increased to 45 mmHg with valsalva. SAM is noted. Doppler parameters are consistent with abnormal left ventricular relaxation (grade 1 diastolic dysfunction). - Aortic valve: Trileaflet. Sclerosis without stenosis. There was no regurgitation. - Mitral valve: Calcified annulus. There was mild regurgitation. -  Left atrium: Moderately dilated at 40 ml/m2. - Right atrium: The atrium was mildly dilated.  Impressions:  - Compared to the prior echo, the HR is slower at 45 - the peak LVOT gradient with valsalva is less at 45 mmHg.  ASSESSMENT AND PLAN:  1. hypertrophic obstructive cardiomyopathy 2. Hypercholesterolemia 3. 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 4. sleep apnea. Patient is considering ENT surgery.  Continue current dose of metoprolol.  On 150 mg twice a day    Current medicines are reviewed at length with the patient today.  The patient does not have concerns regarding medicines.  The following changes have been made:  no change  Labs/ tests ordered today include:   Orders Placed This Encounter  Procedures  . EKG 12-Lead     Disposition: Continue current medication.  After my retirement he will follow-up in 6 months  with Dr. Royann Shivers  Signed, Cassell Clement MD 03/03/2015 5:27 PM    [facility] [address] [place], [place], [place]  [address] Phone: [phone]; Fax: [phone]

## 2015-03-11 ENCOUNTER — Other Ambulatory Visit: Payer: Self-pay | Admitting: Cardiology

## 2015-03-13 DIAGNOSIS — H524 Presbyopia: Secondary | ICD-10-CM | POA: Diagnosis not present

## 2015-03-13 DIAGNOSIS — H5203 Hypermetropia, bilateral: Secondary | ICD-10-CM | POA: Diagnosis not present

## 2015-03-13 DIAGNOSIS — H2513 Age-related nuclear cataract, bilateral: Secondary | ICD-10-CM | POA: Diagnosis not present

## 2015-05-03 ENCOUNTER — Telehealth: Payer: Self-pay | Admitting: Internal Medicine

## 2015-05-03 NOTE — Telephone Encounter (Signed)
On Call Cardiology   Patient had an episode of AF with HR in 130's with some chest discomfort. Marland Kitchen. He took his Dilt 60 mg po x1. Was still having fast HR in the same range. Therefore called. However, soon after HR and rhythm went back to normal in 80's and resolution of symptoms. Reports history of HOCM and HTN. Takes low dose ASA daily. Also on Toprol 150 mg po bid.   No other interventions recommended at this time other than continued monitoring. He was instructed to call us back if any concerns or questions.   Nevin BloodgoodAmir Loretto Belinsky, MD

## 2015-05-05 ENCOUNTER — Ambulatory Visit (INDEPENDENT_AMBULATORY_CARE_PROVIDER_SITE_OTHER): Payer: Medicare Other | Admitting: Physician Assistant

## 2015-05-05 ENCOUNTER — Telehealth: Payer: Self-pay | Admitting: Cardiovascular Disease

## 2015-05-05 VITALS — BP 120/70 | HR 56 | Ht 74.0 in | Wt 238.8 lb

## 2015-05-05 DIAGNOSIS — I421 Obstructive hypertrophic cardiomyopathy: Secondary | ICD-10-CM | POA: Diagnosis not present

## 2015-05-05 DIAGNOSIS — I10 Essential (primary) hypertension: Secondary | ICD-10-CM

## 2015-05-05 DIAGNOSIS — H539 Unspecified visual disturbance: Secondary | ICD-10-CM

## 2015-05-05 DIAGNOSIS — G459 Transient cerebral ischemic attack, unspecified: Secondary | ICD-10-CM

## 2015-05-05 DIAGNOSIS — I480 Paroxysmal atrial fibrillation: Secondary | ICD-10-CM | POA: Diagnosis not present

## 2015-05-05 DIAGNOSIS — E785 Hyperlipidemia, unspecified: Secondary | ICD-10-CM

## 2015-05-05 DIAGNOSIS — R011 Cardiac murmur, unspecified: Secondary | ICD-10-CM

## 2015-05-05 DIAGNOSIS — I4891 Unspecified atrial fibrillation: Secondary | ICD-10-CM | POA: Diagnosis not present

## 2015-05-05 DIAGNOSIS — I6350 Cerebral infarction due to unspecified occlusion or stenosis of unspecified cerebral artery: Secondary | ICD-10-CM | POA: Diagnosis not present

## 2015-05-05 LAB — CBC
HEMATOCRIT: 45.8 % (ref 38.5–50.0)
HEMOGLOBIN: 15.7 g/dL (ref 13.2–17.1)
MCH: 31.6 pg (ref 27.0–33.0)
MCHC: 34.3 g/dL (ref 32.0–36.0)
MCV: 92.2 fL (ref 80.0–100.0)
MPV: 9.7 fL (ref 7.5–12.5)
Platelets: 266 10*3/uL (ref 140–400)
RBC: 4.97 MIL/uL (ref 4.20–5.80)
RDW: 13.4 % (ref 11.0–15.0)
WBC: 7 10*3/uL (ref 3.8–10.8)

## 2015-05-05 MED ORDER — ALPRAZOLAM 0.25 MG PO TABS: 0.2500 mg | ORAL_TABLET | Freq: Every evening | ORAL | Status: AC | PRN

## 2015-05-05 MED ORDER — APIXABAN 5 MG PO TABS: 5.0000 mg | ORAL_TABLET | Freq: Two times a day (BID) | ORAL | Status: DC

## 2015-05-05 MED ORDER — SIMVASTATIN 40 MG PO TABS: 40.0000 mg | ORAL_TABLET | Freq: Every day | ORAL | Status: DC

## 2015-05-05 NOTE — Telephone Encounter (Signed)
Periodic A Fib episodes, which he notices. Seemed like his heart beating was a little faster than in prior episodes. He called on-call physician for an episode on Saturday, received recommendations. Episode resolved after "a while".  Pt notes a separate event - he was working yesterday early AM and had "a kind of dizzy spell which didn't feel like dizziness". He reports concurrent w/ this were visual changes/darkening of vision on left side yesterday which resolved after about 5 mins.  Notes he feels fine today, asymptomatic. He did not seek emergency care yesterday.   Madaline Guthriealled Donna for flex clinic request, pt added for Hao's schedule at 1:30.  Notified pt of appt info and he will come today. Understands instructions if return of symptoms.

## 2015-05-05 NOTE — Patient Instructions (Addendum)
Medication Instructions:   STOP ASPIRIN   START TAKING ELIQUIS 5 MG TWICE A DAY   START TAKING XANAX 0.25MG  AS NEEDED FOR ANXIETY FOR BEING CLAUSTROPHOBIC  SIMVASTATIN 40MG  REFILLED   If you need a refill on your cardiac medications before your next appointment, please call your pharmacy.  Labwork:  BMET AND CBC TODAY   RETURN  FOR FASTING LIPIDS IN A WEEK   Testing/Procedures:  Your physician has requested that you have a carotid duplex. This test is an ultrasound of the carotid arteries in your neck. It looks at blood flow through these arteries that supply the brain with blood. Allow one hour for this exam. There are no restrictions or special instructions.   YOU HAVE BEEN RECCOMMENDED TO GET AN MRI OF THE BRAIN     Follow-Up:  YOU HAVE BEEN REFERRED TO NEUROLOGY TO BE AN ESTABLISHED AS NEW PT FOR TRANSIENT L VISUAL FIELD LOSS                         AFIB CLINIC IN 3 WEEKS //   Any Other Special Instructions Will Be Listed Below (If Applicable).

## 2015-05-05 NOTE — Telephone Encounter (Signed)
NEW MESSAGE   AFIB on Friday night 05-02-15  05/04/15 pt states that he lost has his vision pt worried he has a possible (stroke of the eye)  Pt is doing well today

## 2015-05-05 NOTE — Progress Notes (Signed)
Cardiology Office Note   Date:  05/05/2015   ID:  Shadi, Sessler 1950-08-16, MRN [id number]  PCP:  No PCP Per Patient  Cardiologist: new to Dr. Royann Shivers (Previously Dr. Patty Sermons)    Chief Complaint  Patient presents with  . Follow-up    seen for Dr. Royann Shivers      History of Present Illness: CARNIE BRUEMMER is a 65 y.o. male who presents for cardiology office follow-up. He has a history of known HOCM, PAF, hypertension, hyperlipidemia, obstructive sleep apnea on CPAP. He does not have prior history of ischemic heart disease, last Myoview on 03/30/2006 was normal. Last echocardiogram obtained on 10/03/2013 showed moderate LVH, severe focal basal hypertrophy of the septum, EF 60-65%, mild LVOT obstruction, no regional wall motion abnormality, 15 mmHg rest gradient increased to 45 mmHg with Valsalva, grade 1 diastolic dysfunction, moderately dilated left atrium. Previously he has been seen by a cardiologist to who concurred with his medical regimen. He was invited to engage research study at [facility] but declined to do so. He was seen in February 2014 at the emergency room for your regular pulse, EKG confirmed atrial fibrillation. No treatment was given and he spontaneously went back to normal sinus rhythm while waiting in the ED. He is on PRN dose of diltiazem. He occasional has brief episodes of palpitation but no sustained arrhythmia.  On 05/02/2005, he called after our on-call service for complaining of irregular heart rhythm with heart rate of 130s along with some chest discomfort. His heart rate eventually went back to normal. He was instructed to take low-dose aspirin daily and continue Toprol-XL 150 mg twice a day. So far in the last 2 weeks, he has had at least 2 episodes of tachycardia which he think it's atrial fibrillation. What's even more concerning is that the patient had an episode of left visual field loss around 1 PM in the afternoon of 05/04/2015. He decided not to seek medical  attention at the time. The symptom went away after 5 minutes. However he says he could not see out of both eyes of the left visual field. He presented today for outpatient cardiology evaluation.   EKG obtained in the office shows that he is in sinus bradycardia. He has chronic T wave inversion in the inferolateral leads, looking back at the previous EKG, this has been unchanged since at least 2011. He denies any chest pain, his symptom mainly associated with palpitation. He is currently on 150 mg twice a day of Toprol-XL. His heart rate is in the 50s. I am unable to up titrate the Toprol-XL without dropping the heart rate further. He does not qualify for class IC antiarrhythmic medication given history of structural heart disease with HOCM. Although amiodarone is an option, it should only be reserved if he continued to have further symptom. I am more concerned about his symptom yesterday, which sound like possible TIA. I have called on-call neuroradiologist who recommended obtaining MRI of the brain as CT of the head may miss some the smaller artery or infarct. I have also discussed with Dr. Tenny Craw DOD, who also seen and discussed with patient, who agree his symptom concerning for TIA, we'll refer him to the neurology as outpatient. We will discontinue his aspirin and start eliquis 5 mg twice a day today. Will obtain carotid ultrasound as well. As for further control of atrial fibrillation, I will refer him to the atrial fibrillation clinic for consideration of antiarrhythmic therapy.  Past Medical History  Diagnosis Date  . Hypercholesteremia   . HTN (hypertension)   . Hypertrophic obstructive cardiomyopathy   . OSA on CPAP   . Erectile dysfunction     Past Surgical History  Procedure Laterality Date  . No past surgeries       Current Outpatient Prescriptions  Medication Sig Dispense Refill  . diltiazem (CARDIZEM) 60 MG tablet 1 tablet at onset of AFib or as directed 30 tablet 1  .  metoprolol succinate (TOPROL-XL) 100 MG 24 hr tablet TAKE 1 & 1/2 TABLETS BY MOUTH TWICE DAILY 90 tablet 11  . simvastatin (ZOCOR) 40 MG tablet Take 1 tablet (40 mg total) by mouth daily at 6 PM. 30 tablet 5  . vardenafil (LEVITRA) 10 MG tablet Take 10 mg by mouth daily as needed for erectile dysfunction.    Marland Kitchen ALPRAZolam (XANAX) 0.25 MG tablet Take 1 tablet (0.25 mg total) by mouth at bedtime as needed for anxiety. PRIOR TO MEDICAL PROCEDURE FOR CLAUSTROPHOBIA 10 tablet 0  . apixaban (ELIQUIS) 5 MG TABS tablet Take 1 tablet (5 mg total) by mouth 2 (two) times daily. 60 tablet 5   No current facility-administered medications for this visit.    Allergies:   Review of patient's allergies indicates no known allergies.    Social History:  The patient  reports that he quit smoking about 44 years ago. His smoking use included Cigarettes. He does not have any smokeless tobacco history on file. He reports that he does not drink alcohol or use illicit drugs.   Family History:  The patient's family history includes ALS in his mother; Arrhythmia in his sister; Atrial fibrillation in his brother; Cancer in his father; Hypertension in his father; Other in his father. There is no history of Heart attack or Stroke.    ROS:  Please see the history of present illness.   Otherwise, review of systems are positive for Visual disturbance, palpitation, chest discomfort.   All other systems are reviewed and negative.    PHYSICAL EXAM: VS:  BP 120/70 mmHg  Pulse 56  Ht  (1.88 m)  Wt 238 lb 12.8 oz (108.319 kg)  BMI 30.65 kg/m2 , BMI Body mass index is 30.65 kg/(m^2). GEN: Well nourished, well developed, in no acute distress HEENT: normal Neck: no JVD, carotid bruits, or masses Cardiac: RRR; no murmurs, rubs, or gallops,no edema  Respiratory:  clear to auscultation bilaterally, normal work of breathing GI: soft, nontender, nondistended, + BS MS: no deformity or atrophy Skin: warm and dry, no rash Neuro:   Strength and sensation are intact Psych: euthymic mood, full affect   EKG:  EKG is ordered today. The ekg ordered today demonstrates sinus bradycardia with T-wave inversion in inferolateral leads   Recent Labs: No results found for requested labs within last 365 days.    Lipid Panel    Component Value Date/Time   CHOL 174 01/03/2013 1015   TRIG 201.0* 01/03/2013 1015   HDL 34.60* 01/03/2013 1015   CHOLHDL 5 01/03/2013 1015   VLDL 40.2* 01/03/2013 1015   LDLCALC 82 12/08/2010 0847   LDLDIRECT 121.4 01/03/2013 1015      Wt Readings from Last 3 Encounters:  05/05/15 238 lb 12.8 oz (108.319 kg)  03/03/15 235 lb 12.8 oz (106.958 kg)  08/08/13 241 lb (109.317 kg)      Other studies Reviewed: Additional studies/ records that were reviewed today include:   Echo 10/03/2013 LV EF: 60% -  65%  -------------------------------------------------------------------  Indications:   425.1 Hypertrophic obs cardiomyopathy.  ------------------------------------------------------------------- History:  PMH: PAF. Acquired from the patient and from the patient&'s chart. Risk factors: Former tobacco use. Hypertension. Obese. Dyslipidemia.  ------------------------------------------------------------------- Study Conclusions  - Left ventricle: The cavity size was normal. Wall thickness was increased in a pattern of moderate LVH. There was severe focal basal hypertrophy of the septum. Systolic function was normal. The estimated ejection fraction was in the range of 60% to 65%. Wall motion was normal; there were no regional wall motion abnormalities. Mild LVOT obstruction. There is a 10-15 mmHg rest gradient that increased to 45 mmHg with valsalva. SAM is noted. Doppler parameters are consistent with abnormal left ventricular relaxation (grade 1 diastolic dysfunction). - Aortic valve: Trileaflet. Sclerosis without stenosis. There was no regurgitation. - Mitral  valve: Calcified annulus. There was mild regurgitation. - Left atrium: Moderately dilated at 40 ml/m2. - Right atrium: The atrium was mildly dilated.  Impressions:  - Compared to the prior echo, the HR is slower at 45 - the peak LVOT gradient with valsalva is less at 45 mmHg.     Review of the above records demonstrates:   Patient has history of atrial fibrillation complaining of recurrence recently and noted to have an episode of visual field episode lasted 5 minutes the day prior to office evaluation. He did not seek medical attention for it.   ASSESSMENT AND PLAN:  1.  Recurrent atrial fibrillation - currently in NSR  - Given his age and hypertension, he is CHA2DS2-Vasc score is 2.   - Continue Toprol-XL 150 mg twice a day and diltiazem 60 mg as needed. Refer to atrial fibrillation clinic.  - We will discontinue aspirin and start her eliquis 5 mg twice a day  2. TIA: He had transient loss of left visual field for roughly 5 minutes around 1 PM on 05/04/2015. Will obtain outpatient MRI. Since the event occurred more than 25 hours prior to his visit and the symptom has completely resolved, he does not need emergent evaluation. As for MRI, he does have history of claustrophobia, I have given him once time Rx of 10 tablets of Xanax as needed around the time of MRI, hopefully he can tolerate MRI with benzodiazepine.   - Obtain carotid ultrasound, refer to neurology as outpatient.   3. HOCM: Last echocardiogram obtained on 10/03/2013 showed moderate LVH, severe focal basal hypertrophy of the septum, EF 60-65%, mild LVOT obstruction, no regional wall motion abnormality, 15 mmHg rest gradient increased to 45 mmHg with Valsalva, grade 1 diastolic dysfunction, moderately dilated left atrium  4. Hypertension: On metoprolol 150 mg twice a day, unable to up titrate given bradycardia  5. Hyperlipidemia: He has not taking his simvastatin 40 mg over a year, we will refill his simvastatin and restart  this. He had one episode of muscle ache in the past, this may or may not even related to simvastatin as he has taken simvastatin prior to that without any muscle ache. We will obtain outpatient fasting lipid panel  6. obstructive sleep apnea on CPAP: no obvious issue   Current medicines are reviewed at length with the patient today.  The patient does not have concerns regarding medicines.  The following changes have been made:  Stop aspirin, add eliquis 5mg  BID  Labs/ tests ordered today include:   Orders Placed This Encounter  Procedures  . MR Brain W Wo Contrast  . Basic Metabolic Panel (BMET)  . CBC  . Lipid Profile  . Ambulatory referral  to Neurology  . EKG 12-Lead     Disposition:   Refer to Heart Failure clinic   Signed, Azalee CourseMeng, Allanna Bresee, [place]  05/05/2015 6:32 PM    [facility] [address], [place], [place]  [address] Phone: [phone]; Fax: [phone]

## 2015-05-06 ENCOUNTER — Other Ambulatory Visit: Payer: Self-pay | Admitting: Physician Assistant

## 2015-05-06 DIAGNOSIS — H539 Unspecified visual disturbance: Secondary | ICD-10-CM

## 2015-05-06 LAB — BASIC METABOLIC PANEL
BUN: 15 mg/dL (ref 7–25)
CO2: 26 mmol/L (ref 20–31)
Calcium: 9.2 mg/dL (ref 8.6–10.3)
Chloride: 105 mmol/L (ref 98–110)
Creat: 1.09 mg/dL (ref 0.70–1.25)
GLUCOSE: 81 mg/dL (ref 65–99)
Potassium: 4.7 mmol/L (ref 3.5–5.3)
SODIUM: 139 mmol/L (ref 135–146)

## 2015-05-07 ENCOUNTER — Other Ambulatory Visit: Payer: Self-pay | Admitting: Physician Assistant

## 2015-05-07 ENCOUNTER — Ambulatory Visit (HOSPITAL_COMMUNITY)
Admission: RE | Admit: 2015-05-07 | Discharge: 2015-05-07 | Disposition: A | Discharge status: Home / Self Care | Payer: Medicare Other | Source: Ambulatory Visit | Attending: Cardiovascular Disease | Admitting: Cardiovascular Disease

## 2015-05-07 DIAGNOSIS — H539 Unspecified visual disturbance: Secondary | ICD-10-CM | POA: Insufficient documentation

## 2015-05-07 DIAGNOSIS — I10 Essential (primary) hypertension: Secondary | ICD-10-CM | POA: Diagnosis not present

## 2015-05-07 DIAGNOSIS — E785 Hyperlipidemia, unspecified: Secondary | ICD-10-CM | POA: Insufficient documentation

## 2015-05-07 DIAGNOSIS — I6523 Occlusion and stenosis of bilateral carotid arteries: Secondary | ICD-10-CM | POA: Diagnosis not present

## 2015-05-07 DIAGNOSIS — Z87891 Personal history of nicotine dependence: Secondary | ICD-10-CM | POA: Diagnosis not present

## 2015-05-07 DIAGNOSIS — R42 Dizziness and giddiness: Secondary | ICD-10-CM | POA: Diagnosis not present

## 2015-05-09 DIAGNOSIS — I639 Cerebral infarction, unspecified: Secondary | ICD-10-CM | POA: Diagnosis not present

## 2015-05-12 ENCOUNTER — Other Ambulatory Visit (INDEPENDENT_AMBULATORY_CARE_PROVIDER_SITE_OTHER): Payer: Medicare Other | Admitting: *Deleted

## 2015-05-12 DIAGNOSIS — E785 Hyperlipidemia, unspecified: Secondary | ICD-10-CM | POA: Diagnosis not present

## 2015-05-12 LAB — LIPID PANEL
CHOL/HDL RATIO: 5.7 ratio — AB (ref <=5.0)
CHOLESTEROL: 201 mg/dL — AB (ref 125–200)
HDL: 35 mg/dL — AB (ref >=40)
LDL Cholesterol: 121 mg/dL (ref <130)
TRIGLYCERIDES: 226 mg/dL — AB (ref <150)
VLDL: 45 mg/dL — AB (ref <30)

## 2015-05-14 ENCOUNTER — Encounter: Payer: Self-pay | Admitting: *Deleted

## 2015-05-15 ENCOUNTER — Encounter: Payer: Self-pay | Admitting: Physician Assistant

## 2015-06-02 ENCOUNTER — Other Ambulatory Visit: Payer: Self-pay | Admitting: Neurology

## 2015-06-02 ENCOUNTER — Ambulatory Visit (INDEPENDENT_AMBULATORY_CARE_PROVIDER_SITE_OTHER): Payer: Medicare Other | Admitting: Neurology

## 2015-06-02 ENCOUNTER — Encounter: Payer: Self-pay | Admitting: Neurology

## 2015-06-02 VITALS — BP 134/76 | HR 67 | Ht 74.0 in | Wt 237.0 lb

## 2015-06-02 DIAGNOSIS — I6523 Occlusion and stenosis of bilateral carotid arteries: Secondary | ICD-10-CM

## 2015-06-02 DIAGNOSIS — E785 Hyperlipidemia, unspecified: Secondary | ICD-10-CM

## 2015-06-02 DIAGNOSIS — I639 Cerebral infarction, unspecified: Secondary | ICD-10-CM

## 2015-06-02 DIAGNOSIS — I480 Paroxysmal atrial fibrillation: Secondary | ICD-10-CM

## 2015-06-02 LAB — HEPATIC FUNCTION PANEL
ALK PHOS: 58 U/L (ref 40–115)
ALT: 21 U/L (ref 9–46)
AST: 17 U/L (ref 10–35)
Albumin: 4 g/dL (ref 3.6–5.1)
BILIRUBIN DIRECT: 0.1 mg/dL (ref <=0.2)
BILIRUBIN INDIRECT: 0.5 mg/dL (ref 0.2–1.2)
BILIRUBIN TOTAL: 0.6 mg/dL (ref 0.2–1.2)
Total Protein: 6.3 g/dL (ref 6.1–8.1)

## 2015-06-02 NOTE — Progress Notes (Signed)
NEUROLOGY CONSULTATION NOTE  Roxan HockeyWilliam M Beck MRN: [id number] DOB: October 06, 1950  Referring provider: Azalee CourseHao Meng, PA Primary care provider: no PCP   Reason for consult:  stroke  HISTORY OF PRESENT ILLNESS: Wayne Beck is a 65 year old right-handed male with hypertrophic obstructive cardiomyopathy, hypertension, OSA on CPAP, hypercholesterolemia, and paroxysmal atrial fibrillation who presents for stroke.  History obtained by patient and cardiology note.  Labs, doppler and echo reports and imaging of brain MRI personally reviewed.  On 05/04/15, he had transient vision loss in the left side of his visual field.  It was present when he closed either eye.  He denied associated headache, unilateral numbness or weakness, facial droop, dizziness or slurred speech.  Symptoms occurred off and on for about 4 minutes and spontaneously resolved.  He did not seek medical attention at that time.  The next day, he informed his cardiologist about this episode.  Given history of paroxysmal atrial fibrillation in the past, aspirin was switched to Eliquis.  MRI of brain performed on 05/09/15 comfirmed a small ischemic infarct in the right inferior parietal lobe.  Carotid doppler performed on 05/07/15 demonstrated homogeneous plaque bilaterally but no hemodynamically significant bilateral ICA stenosis (1-39%).  Vertebral arteries revealed antegrade flow. Fasting lipid panel from 05/12/15 showed total cholesterol 201, TG 226, HDL 35 and LDL 121.  He reportedly had not been taking his simvastatin and was restarted on 40mg  daily.  EKG showed sinus bradycardia but no atrial fibrillation.  Serum glucose from 05/05/15 was 81.  Last echocardiogram from 10/03/13 showed EF 60-65% with moderate LVH, severe focal basal hypertrophy of the septum, mild LVOT obstruction, no regional wall motion abnormality and grade 1 diastolic dysfunction.  CBC and BMP from 05/05/15 were unremarkable with serum glucose of 81.   PAST MEDICAL HISTORY: Past  Medical History  Diagnosis Date  . Hypercholesteremia   . HTN (hypertension)   . Hypertrophic obstructive cardiomyopathy   . OSA on CPAP   . Erectile dysfunction     PAST SURGICAL HISTORY: Past Surgical History  Procedure Laterality Date  . No past surgeries      MEDICATIONS: Current Outpatient Prescriptions on File Prior to Visit  Medication Sig Dispense Refill  . ALPRAZolam (XANAX) 0.25 MG tablet Take 1 tablet (0.25 mg total) by mouth at bedtime as needed for anxiety. PRIOR TO MEDICAL PROCEDURE FOR CLAUSTROPHOBIA 10 tablet 0  . apixaban (ELIQUIS) 5 MG TABS tablet Take 1 tablet (5 mg total) by mouth 2 (two) times daily. 60 tablet 5  . diltiazem (CARDIZEM) 60 MG tablet 1 tablet at onset of AFib or as directed 30 tablet 1  . metoprolol succinate (TOPROL-XL) 100 MG 24 hr tablet TAKE 1 & 1/2 TABLETS BY MOUTH TWICE DAILY 90 tablet 11  . simvastatin (ZOCOR) 40 MG tablet Take 1 tablet (40 mg total) by mouth daily at 6 PM. 30 tablet 5  . vardenafil (LEVITRA) 10 MG tablet Take 10 mg by mouth daily as needed for erectile dysfunction.     No current facility-administered medications on file prior to visit.    ALLERGIES: No Known Allergies  FAMILY HISTORY: Family History  Problem Relation Age of Onset  . Other Father     CARDIOMEGALY  . Cancer Father   . ALS Mother   . Atrial fibrillation Brother   . Arrhythmia Sister     History of ablation and  . Heart attack Neg Hx   . Stroke Neg Hx   . Hypertension Father  SOCIAL HISTORY: Social History   Social History  . Marital Status: Single    Spouse Name: N/A  . Number of Children: N/A  . Years of Education: N/A   Occupational History  . Not on file.   Social History Main Topics  . Smoking status: Former Smoker    Types: Cigarettes    Quit date: 04/21/1971  . Smokeless tobacco: Not on file  . Alcohol Use: No  . Drug Use: No  . Sexual Activity: Yes   Other Topics Concern  . Not on file   Social History Narrative      REVIEW OF SYSTEMS: Constitutional: No fevers, chills, or sweats, no generalized fatigue, change in appetite Eyes: No visual changes, double vision, eye pain Ear, nose and throat: No hearing loss, ear pain, nasal congestion, sore throat Cardiovascular: No chest pain, palpitations Respiratory:  No shortness of breath at rest or with exertion, wheezes GastrointestinaI: No nausea, vomiting, diarrhea, abdominal pain, fecal incontinence Genitourinary:  No dysuria, urinary retention or frequency Musculoskeletal:  No neck pain, back pain Integumentary: No rash, pruritus, skin lesions Neurological: as above Psychiatric: No depression, insomnia, anxiety Endocrine: No palpitations, fatigue, diaphoresis, mood swings, change in appetite, change in weight, increased thirst Hematologic/Lymphatic:  No anemia, purpura, petechiae. Allergic/Immunologic: no itchy/runny eyes, nasal congestion, recent allergic reactions, rashes  PHYSICAL EXAM: Filed Vitals:   06/02/15 0820  BP: 134/76  Pulse: 67   General: No acute distress.  Patient appears well-groomed.  Head:  Normocephalic/atraumatic Eyes:  fundi examined but not visualized Neck: supple, no paraspinal tenderness, full range of motion Back: No paraspinal tenderness Heart: regular rate, irregular rhythm Lungs: Clear to auscultation bilaterally. Vascular: No carotid bruits. Neurological Exam: Mental status: alert and oriented to person, place, and time, recent and remote memory intact, fund of knowledge intact, attention and concentration intact, speech fluent and not dysarthric, language intact. Cranial nerves: CN I: not tested CN II: pupils equal, round and reactive to light, visual fields intact CN III, IV, VI:  full range of motion, no nystagmus, no ptosis CN V: facial sensation intact CN VII: upper and lower face symmetric CN VIII: hearing intact CN IX, X: gag intact, uvula midline CN XI: sternocleidomastoid and trapezius muscles  intact CN XII: tongue midline Bulk & Tone: normal, no fasciculations. Motor:  5/5 throughout  Sensation: temperature and vibration sensation intact. Deep Tendon Reflexes:  2+ throughout, toes downgoing.  Finger to nose testing:  Without dysmetria.  Heel to shin:  Without dysmetria.  Gait:  Normal station and stride.  Able to turn and tandem walk. Romberg negative.  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  On Eliquis for secondary stroke prevention 2.  Ideally, I would like to switch to a high-potency statin, such as Lipitor  daily.  However, with concomitant diltiazem, he may have increased risk for myopathy.  I will have him continue simvastatin  daily for now and consult with cardiology regarding if we could make the switch.  LDL goal should be less than 70).  Recheck LDL in 3 months.  Will also check baseline LFTs. 3.  Blood pressure control 4.  Mediterranean diet 5.  Discussed stroke warning signs and to go to ED immediately if he experiences them. 6.  Follow up in 6 months.  Thank you for allowing me to take part in the care of this patient.  Shon Millet, DO  CC:  Azalee Course, [place]  Thurmon Fair, MD

## 2015-06-02 NOTE — Patient Instructions (Addendum)
1.  Continue the Eliquis 2.  Continue simvastatin  daily for now.  I will check with your cardiologist if it is okay to start Lipitor instead as it may interfere with the Cardizem. We will check baseline LFTs.  I want to repeat fasting lipid panel in 3 months. 3.  Mediterranean diet    Why follow it? Research shows. . Those who follow the Mediterranean diet have a reduced risk of heart disease  . The diet is associated with a reduced incidence of Parkinson's and Alzheimer's diseases . People following the diet may have longer life expectancies and lower rates of chronic diseases  . The Dietary Guidelines for Americans recommends the Mediterranean diet as an eating plan to promote health and prevent disease  What Is the Mediterranean Diet?  . Healthy eating plan based on typical foods and recipes of Mediterranean-style cooking . The diet is primarily a plant based diet; these foods should make up a majority of meals   Starches - Plant based foods should make up a majority of meals - They are an important sources of vitamins, minerals, energy, antioxidants, and fiber - Choose whole grains, foods high in fiber and minimally processed items  - Typical grain sources include wheat, oats, barley, corn, brown rice, bulgar, farro, millet, polenta, couscous  - Various types of beans include chickpeas, lentils, fava beans, black beans, white beans   Fruits  Veggies - Large quantities of antioxidant rich fruits & veggies; 6 or more servings  - Vegetables can be eaten raw or lightly drizzled with oil and cooked  - Vegetables common to the traditional Mediterranean Diet include: artichokes, arugula, beets, broccoli, brussel sprouts, cabbage, carrots, celery, collard greens, cucumbers, eggplant, kale, leeks, lemons, lettuce, mushrooms, okra, onions, peas, peppers, potatoes, pumpkin, radishes, rutabaga, shallots, spinach, sweet potatoes, turnips, zucchini - Fruits common to the Mediterranean Diet include:  apples, apricots, avocados, cherries, clementines, dates, figs, grapefruits, grapes, melons, nectarines, oranges, peaches, pears, pomegranates, strawberries, tangerines  Fats - Replace butter and margarine with healthy oils, such as olive oil, canola oil, and tahini  - Limit nuts to no more than a handful a day  - Nuts include walnuts, almonds, pecans, pistachios, pine nuts  - Limit or avoid candied, honey roasted or heavily salted nuts - Olives are central to the [employer] - can be eaten whole or used in a variety of dishes   Meats Protein - Limiting red meat: no more than a few times a month - When eating red meat: choose lean cuts and keep the portion to the size of deck of cards - Eggs: approx. 0 to 4 times a week  - Fish and lean poultry: at least 2 a week  - Healthy protein sources include, chicken, [place], lean beef, lamb - Increase intake of seafood such as tuna, salmon, trout, mackerel, shrimp, scallops - Avoid or limit high fat processed meats such as sausage and bacon  Dairy - Include moderate amounts of low fat dairy products  - Focus on healthy dairy such as fat free yogurt, skim milk, low or reduced fat cheese - Limit dairy products higher in fat such as whole or 2% milk, cheese, ice cream  Alcohol - Moderate amounts of red wine is ok  - No more than 5 oz daily for women (all ages) and men older than age 31  - No more than 10 oz of wine daily for men younger than 65  Other - Limit sweets and other desserts  -  Use herbs and spices instead of salt to flavor foods  - Herbs and spices common to the traditional Mediterranean Diet include: basil, bay leaves, chives, cloves, cumin, fennel, garlic, lavender, marjoram, mint, oregano, parsley, pepper, rosemary, sage, savory, sumac, tarragon, thyme   It's not just a diet, it's a lifestyle:  . The Mediterranean diet includes lifestyle factors typical of those in the region  . Foods, drinks and meals are best eaten with others and  savored . Daily physical activity is important for overall good health . This could be strenuous exercise like running and aerobics . This could also be more leisurely activities such as walking, housework, yard-work, or taking the stairs . Moderation is the key; a balanced and healthy diet accommodates most foods and drinks . Consider portion sizes and frequency of consumption of certain foods   Meal Ideas & Options:  . Breakfast:  o Whole wheat toast or whole wheat English muffins with peanut butter & hard boiled egg o Steel cut oats topped with apples & cinnamon and skim milk  o Fresh fruit: banana, strawberries, melon, berries, peaches  o Smoothies: strawberries, bananas, greek yogurt, peanut butter o Low fat greek yogurt with blueberries and granola  o Egg white omelet with spinach and mushrooms o Breakfast couscous: whole wheat couscous, apricots, skim milk, cranberries  . Sandwiches:  o Hummus and grilled vegetables (peppers, zucchini, squash) on whole wheat bread   o Grilled chicken on whole wheat pita with lettuce, tomatoes, cucumbers or tzatziki  o Tuna salad on whole wheat bread: tuna salad made with greek yogurt, olives, red peppers, capers, green onions o Garlic rosemary lamb pita: lamb sauted with garlic, rosemary, salt & pepper; add lettuce, cucumber, greek yogurt to pita - flavor with lemon juice and black pepper  . Seafood:  o Mediterranean grilled salmon, seasoned with garlic, basil, parsley, lemon juice and black pepper o Shrimp, lemon, and spinach whole-grain pasta salad made with low fat greek yogurt  o Seared scallops with lemon orzo  o Seared tuna steaks seasoned salt, pepper, coriander topped with tomato mixture of olives, tomatoes, olive oil, minced garlic, parsley, green onions and cappers  . Meats:  o Herbed greek chicken salad with kalamata olives, cucumber, feta  o Red bell peppers stuffed with spinach, bulgur, lean ground beef (or lentils) & topped with feta    o Kebabs: skewers of chicken, tomatoes, onions, zucchini, squash  o [place] burgers: made with red onions, mint, dill, lemon juice, feta cheese topped with roasted red peppers . Vegetarian o Cucumber salad: cucumbers, artichoke hearts, celery, red onion, feta cheese, tossed in olive oil & lemon juice  o Hummus and whole grain pita points with a greek salad (lettuce, tomato, feta, olives, cucumbers, red onion) o Lentil soup with celery, carrots made with vegetable broth, garlic, salt and pepper  o Tabouli salad: parsley, bulgur, mint, scallions, cucumbers, tomato, radishes, lemon juice, olive oil, salt and pepper. 4.  Follow up in 6 months.

## 2015-06-03 ENCOUNTER — Telehealth: Payer: Self-pay

## 2015-06-03 MED ORDER — ROSUVASTATIN CALCIUM 40 MG PO TABS: 40.0000 mg | ORAL_TABLET | Freq: Every day | ORAL | Status: DC

## 2015-06-03 NOTE — Telephone Encounter (Signed)
Results were left on pt's voicemail, with instructions to call back with any questions or concerns in relation to results.   

## 2015-06-03 NOTE — Telephone Encounter (Signed)
-----   Message from Drema DallasAdam R Jaffe, DO sent at 06/03/2015  7:32 AM EDT ----- LFTs are normal.  I heard back from Dr. Royann Shiversroitoru regarding his statin.  We can switch the simvastatin to rosuvastatin 40 mg daily.  Recheck fasting lipid panel prior to follow up

## 2015-06-04 ENCOUNTER — Ambulatory Visit (HOSPITAL_COMMUNITY)
Admission: RE | Admit: 2015-06-04 | Discharge: 2015-06-04 | Disposition: A | Discharge status: Home / Self Care | Payer: Medicare Other | Source: Ambulatory Visit | Attending: Nurse Practitioner | Admitting: Nurse Practitioner

## 2015-06-04 ENCOUNTER — Encounter (HOSPITAL_COMMUNITY): Payer: Self-pay | Admitting: Nurse Practitioner

## 2015-06-04 VITALS — BP 142/74 | HR 58 | Ht 74.0 in | Wt 238.2 lb

## 2015-06-04 DIAGNOSIS — Z7901 Long term (current) use of anticoagulants: Secondary | ICD-10-CM | POA: Diagnosis not present

## 2015-06-04 DIAGNOSIS — G4733 Obstructive sleep apnea (adult) (pediatric): Secondary | ICD-10-CM | POA: Insufficient documentation

## 2015-06-04 DIAGNOSIS — E7800 Pure hypercholesterolemia, unspecified: Secondary | ICD-10-CM | POA: Diagnosis not present

## 2015-06-04 DIAGNOSIS — I10 Essential (primary) hypertension: Secondary | ICD-10-CM | POA: Diagnosis not present

## 2015-06-04 DIAGNOSIS — Z87891 Personal history of nicotine dependence: Secondary | ICD-10-CM | POA: Insufficient documentation

## 2015-06-04 DIAGNOSIS — I421 Obstructive hypertrophic cardiomyopathy: Secondary | ICD-10-CM | POA: Insufficient documentation

## 2015-06-04 DIAGNOSIS — Z8249 Family history of ischemic heart disease and other diseases of the circulatory system: Secondary | ICD-10-CM | POA: Diagnosis not present

## 2015-06-04 DIAGNOSIS — Z79899 Other long term (current) drug therapy: Secondary | ICD-10-CM | POA: Insufficient documentation

## 2015-06-04 DIAGNOSIS — N529 Male erectile dysfunction, unspecified: Secondary | ICD-10-CM | POA: Insufficient documentation

## 2015-06-04 DIAGNOSIS — I480 Paroxysmal atrial fibrillation: Secondary | ICD-10-CM | POA: Diagnosis not present

## 2015-06-04 DIAGNOSIS — I4891 Unspecified atrial fibrillation: Secondary | ICD-10-CM | POA: Diagnosis present

## 2015-06-04 NOTE — Progress Notes (Signed)
Patient ID: Wayne Beck, male   DOB: [birth year], 65 y.o.   MRN: [id number]     Primary Care Physician: No PCP Per Patient Referring Physician: previously Dr. Evaristo Bury Wayne Beck is a 65 y.o. male with a h/o HOCM, PAF, hypertension, hyperlipidemia, obstructive sleep apnea on CPAP. He does not have prior history of ischemic heart disease, last Myoview on 03/30/2006 was normal. Last echocardiogram obtained on 10/03/2013 showed moderate LVH, severe focal basal hypertrophy of the septum, EF 60-65%, mild LVOT obstruction, no regional wall motion abnormality, 15 mmHg rest gradient increased to 45 mmHg with Valsalva, grade 1 diastolic dysfunction, moderately dilated left atrium.He was seen in February 2014 at the emergency room for your regular pulse, EKG confirmed atrial fibrillation. No treatment was given and he spontaneously went back to normal sinus rhythm while waiting in the ED. He is on PRN dose of diltiazem. He occasional has brief episodes of palpitation but no sustained arrhythmia.  On 05/03/2015, he called after our on-call service for complaining of irregular heart rhythm with heart rate of 130s along with some chest discomfort. His heart rate eventually went back to normal. He was instructed to take low-dose aspirin daily and continue Toprol-XL 150 mg twice a day. So far in the last 2 weeks, he has had at least 2 episodes of tachycardia which he think it's atrial fibrillation. What's even more concerning is that the patient had an episode of left visual field loss around 1 PM in the afternoon of 05/04/2015. He decided not to seek medical attention at the time. The symptom went away after 5 minutes. However he says he could not see out of both eyes of the left visual field. He presented 4/3 for outpatient cardiology evaluation, seen by Azalee Course, PA.  EKG obtained in the office shows that he is in sinus bradycardia. He had chronic T wave inversion in the inferolateral leads, looking back at the  previous EKG, this has been unchanged since at least 2011. He denies any chest pain, his symptom mainly associated with palpitation. He is currently on 150 mg twice a day of Toprol-XL. His heart rate is in the 50s. Neuroradiologist who recommended obtaining MRI of the brain as CT of the head may miss some the smaller artery or infarct.  Discussed with Dr. Kyla Balzarine DOD, who also seen and discussed with patient, who agree his symptom concerning for TIA, we'll refer him to the neurology as outpatient. We will discontinue his aspirin and start eliquis 5 mg twice a day today. Will obtain carotid ultrasound as well. As for further control of atrial fibrillation, I will refer him to the atrial fibrillation clinic for consideration of antiarrhythmic therapy.   He is seen in the afib clinic today. He is on apixaban without any issues, chadsvasc score of at least 3. Understands importance of taking without interruption. He describes his afib burden as maybe 6-8 episodes a year usually lasting 2 hours, responds well to cardizem 60 mg.Was seen by neurology 5/1, Visual symptoms have resolved.He is using his cpap but has not been evaluated in years to see if still effective. He is wanting to establish with a new cardiologist as well as someone that can manage cpap.He admits to be overweight and inactive. No alcohol/tobacco/excessive caffeine.  Today, he denies symptoms of palpitations, chest pain, shortness of breath, orthopnea, PND, lower extremity edema, dizziness, presyncope, syncope, or neurologic sequela. The patient is tolerating medications without difficulties and is otherwise without complaint today.  Past Medical History  Diagnosis Date  . Hypercholesteremia   . HTN (hypertension)   . Hypertrophic obstructive cardiomyopathy   . OSA on CPAP   . Erectile dysfunction    Past Surgical History  Procedure Laterality Date  . No past surgeries      Current Outpatient Prescriptions  Medication Sig Dispense  Refill  . ALPRAZolam (XANAX) 0.25 MG tablet Take 1 tablet (0.25 mg total) by mouth at bedtime as needed for anxiety. PRIOR TO MEDICAL PROCEDURE FOR CLAUSTROPHOBIA 10 tablet 0  . apixaban (ELIQUIS) 5 MG TABS tablet Take 1 tablet (5 mg total) by mouth 2 (two) times daily. 60 tablet 5  . diltiazem (CARDIZEM) 60 MG tablet 1 tablet at onset of AFib or as directed 30 tablet 1  . metoprolol succinate (TOPROL-XL) 100 MG 24 hr tablet TAKE 1 & 1/2 TABLETS BY MOUTH TWICE DAILY 90 tablet 11  . rosuvastatin (CRESTOR) 40 MG tablet Take 1 tablet (40 mg total) by mouth daily. 30 tablet 3  . vardenafil (LEVITRA) 10 MG tablet Take 10 mg by mouth daily as needed for erectile dysfunction.     No current facility-administered medications for this encounter.    No Known Allergies  Social History   Social History  . Marital Status: Single    Spouse Name: N/A  . Number of Children: N/A  . Years of Education: N/A   Occupational History  . Not on file.   Social History Main Topics  . Smoking status: Former Smoker    Types: Cigarettes    Quit date: 04/21/1971  . Smokeless tobacco: Not on file  . Alcohol Use: No  . Drug Use: No  . Sexual Activity: Yes   Other Topics Concern  . Not on file   Social History Narrative    Family History  Problem Relation Age of Onset  . Other Father     CARDIOMEGALY  . Cancer Father   . ALS Mother   . Atrial fibrillation Brother   . Arrhythmia Sister     History of ablation and  . Heart attack Neg Hx   . Stroke Neg Hx   . Hypertension Father     ROS- All systems are reviewed and negative except as per the HPI above  Physical Exam: Filed Vitals:   06/04/15 1528  BP: 142/74  Pulse: 58  Height: 6\' 2"  (1.88 m)  Weight: 238 lb 3.2 oz (108.047 kg)    GEN- The patient is well appearing, alert and oriented x 3 today.   Head- normocephalic, atraumatic Eyes-  Sclera clear, conjunctiva pink Ears- hearing intact Oropharynx- clear Neck- supple, no JVP Lymph-  no cervical lymphadenopathy Lungs- Clear to ausculation bilaterally, normal work of breathing Heart- Regular rate and rhythm, 2/6 murmur, no rubs or gallops, PMI not laterally displaced GI- soft, NT, ND, + BS Extremities- no clubbing, cyanosis, or edema MS- no significant deformity or atrophy Skin- no rash or lesion Psych- euthymic mood, full affect Neuro- strength and sensation are intact  EKG-SB at 58 bpm, with sinus arrythmia, Pr int 158 ms, qrs int 96 ms qtc 418 m Epic records reviewd MRI of brain performed on 05/09/15 comfirmed a small ischemic infarct in the right inferior parietal lobe. Carotid doppler performed on 05/07/15 demonstrated homogeneous plaque bilaterally but no hemodynamically significant bilateral ICA stenosis (1-39%). Vertebral arteries revealed antegrade flow. Fasting lipid panel from 05/12/15 showed total cholesterol 201, TG 226, HDL 35 and LDL 121. He reportedly had not been taking his  simvastatin and was restarted on 40mg  daily. EKG showed sinus bradycardia but no atrial fibrillation. Serum glucose from 05/05/15 was 81. Last echocardiogram from 10/03/13 showed EF 60-65% with moderate LVH, severe focal basal hypertrophy of the septum, mild LVOT obstruction, no regional wall motion abnormality and grade 1 diastolic dysfunction.  Assessment and Plan: 1.PAF with presumed cardio embolic CVA  Continue eliquis 5 mg bid for secondary stroke prevention, aware of bleeding precautions Continue daily BB with prn cardizem for breakthrough afib Right now afib burden is low and with HOCM with mild obstruction, Norpace may be his option for antiarrythmic, vrs sotalol, amiodaraone, best choices with non obstructive HOCM, per discussion with PharmD. However for now he wants to work on lifestyle  and lose weight, be more active and get CPAP checked since  most of episodes occur at night Will request pt to get established with Dr. Tresa Endo, with retirement of Dr. Patty Sermons for cardiology and care  of sleep apnea.   afib clinic as needed  [facility]. [facility] [address] [place], [place] [address] [phone]

## 2015-06-09 NOTE — Progress Notes (Signed)
Thank you Dr. Madelaine BhatAdam for seeing this patient, [facility], although Zocor >10mg  does have significant interaction with diltiazem and can cause rhabdomyopathy, chance is relatively lower with 40mg  Crestor or 80mg  Lipitor. I think it is reasonable to switch to 80mg  Lipitor. We will obtain repeat LFT and Lipid panel in 2 month after start new statin. Victorino DikeJennifer please inform patient the change in medication and repeat lab, although instruct patient to contact cardiology if do develop muscle ache.

## 2015-06-10 ENCOUNTER — Telehealth: Payer: Self-pay | Admitting: Cardiovascular Disease

## 2015-06-11 ENCOUNTER — Telehealth: Payer: Self-pay | Admitting: *Deleted

## 2015-06-11 NOTE — Telephone Encounter (Signed)
lmptcb re: change of a medication, per dr. Everlena Cooperjaffe request and per Azalee CourseHao Meng, PA-C, change his Crestor to lipitor 80mg  daily,

## 2015-06-11 NOTE — Telephone Encounter (Signed)
-----   Message from HuguleyHao Meng, [place] sent at 06/09/2015  1:14 PM EDT -----   ----- Message -----    From: Drema DallasAdam R Jaffe, DO    Sent: 06/02/2015   9:29 AM      To: Azalee CourseHao Meng, PA  Hello.  Ideally after a stroke, we would like to start high-potency statin (Lipitor 80mg ).  LDL goal should be less than 70.  Given that he is on Cardizem, is this an option (as the combination may increase risk for rhabdo and myopathy)?  Thank you. Madelaine BhatAdam

## 2015-06-11 NOTE — Progress Notes (Signed)
Yes, I have responded by starting 80mg  lipitor. i will check with my office staff to see if it has been started.

## 2015-06-11 NOTE — Progress Notes (Signed)
Please call the patient to change the crestor to Lipitor 80mg  daily. Repeat Liver function test and fasting Lipid test in 2 month. Thanks

## 2015-06-12 NOTE — Telephone Encounter (Signed)
Close encounter 

## 2015-06-13 ENCOUNTER — Telehealth: Payer: Self-pay | Admitting: *Deleted

## 2015-06-13 DIAGNOSIS — E785 Hyperlipidemia, unspecified: Secondary | ICD-10-CM

## 2015-06-13 MED ORDER — ATORVASTATIN CALCIUM 80 MG PO TABS: 80.0000 mg | ORAL_TABLET | Freq: Every day | ORAL | Status: DC

## 2015-06-13 NOTE — Telephone Encounter (Signed)
Per Azalee CourseHao Meng, PA-C, I called pt to let him know that after the discussion with Wynema BirchHao and Dr. Everlena CooperJaffe, Neurologist, that we have decided to d/c the Crestor and add Lipitor 80 mg taking 1 tablet daily, and will repeat lipid panel and lft 08/11/15 week. Per pt request, order put in for the NL office.

## 2015-06-23 ENCOUNTER — Ambulatory Visit (INDEPENDENT_AMBULATORY_CARE_PROVIDER_SITE_OTHER): Payer: Medicare Other | Admitting: Cardiovascular Disease

## 2015-06-23 ENCOUNTER — Encounter: Payer: Self-pay | Admitting: Cardiovascular Disease

## 2015-06-23 VITALS — BP 110/70 | Ht 74.0 in | Wt 236.8 lb

## 2015-06-23 DIAGNOSIS — I6523 Occlusion and stenosis of bilateral carotid arteries: Secondary | ICD-10-CM

## 2015-06-23 DIAGNOSIS — Z9989 Dependence on other enabling machines and devices: Secondary | ICD-10-CM

## 2015-06-23 DIAGNOSIS — G4733 Obstructive sleep apnea (adult) (pediatric): Secondary | ICD-10-CM

## 2015-06-23 DIAGNOSIS — I10 Essential (primary) hypertension: Secondary | ICD-10-CM | POA: Diagnosis not present

## 2015-06-23 DIAGNOSIS — Z79899 Other long term (current) drug therapy: Secondary | ICD-10-CM

## 2015-06-23 DIAGNOSIS — R011 Cardiac murmur, unspecified: Secondary | ICD-10-CM | POA: Diagnosis not present

## 2015-06-23 DIAGNOSIS — I480 Paroxysmal atrial fibrillation: Secondary | ICD-10-CM

## 2015-06-23 DIAGNOSIS — E785 Hyperlipidemia, unspecified: Secondary | ICD-10-CM

## 2015-06-23 DIAGNOSIS — E782 Mixed hyperlipidemia: Secondary | ICD-10-CM

## 2015-06-23 DIAGNOSIS — I421 Obstructive hypertrophic cardiomyopathy: Secondary | ICD-10-CM

## 2015-06-23 NOTE — Patient Instructions (Addendum)
Your physician has recommended that you have a sleep study. This test records several body functions during sleep, including: brain activity, eye movement, oxygen and carbon dioxide blood levels, heart rate and rhythm, breathing rate and rhythm, the flow of air through your mouth and nose, snoring, body muscle movements, and chest and belly movement. This will be done at [facility].  Your physician has requested that you have an echocardiogram. Echocardiography is a painless test that uses sound waves to create images of your heart. It provides your doctor with information about the size and shape of your heart and how well your heart's chambers and valves are working. This procedure takes approximately one hour. There are no restrictions for this procedure.  Your physician recommends that you return for lab work .  Your physician recommends that you schedule a follow-up appointment in: 6-8 weeks.

## 2015-06-25 ENCOUNTER — Encounter: Payer: Self-pay | Admitting: Cardiovascular Disease

## 2015-06-25 DIAGNOSIS — E782 Mixed hyperlipidemia: Secondary | ICD-10-CM | POA: Insufficient documentation

## 2015-06-25 DIAGNOSIS — I10 Essential (primary) hypertension: Secondary | ICD-10-CM | POA: Insufficient documentation

## 2015-06-25 DIAGNOSIS — I421 Obstructive hypertrophic cardiomyopathy: Secondary | ICD-10-CM | POA: Insufficient documentation

## 2015-06-25 NOTE — Progress Notes (Signed)
Patient ID: Wayne Beck, male   DOB: May 10, 1950, 65 y.o.   MRN: [id number]     Primary MD: none  PATIENT PROFILE: Wayne Beck is a 65 y.o. male who is a former patient of Dr. Mare Ferrari.  Wayne Beck is referred from the atrial fibrillation clinic for reestablishment of cardiology care and also for evaluation of sleep apnea.   HPI:  Wayne Beck has a history of hypertrophic obstructive cardiomyopathy, paroxysmal atrial fibrillation, hypertension, hyperlipidemia, and obstructive sleep apnea.  Wayne Beck states that Wayne Beck was diagnosed with sleep apnea almost 20 years ago.  Wayne Beck is on his second machine and his most recent machine is at least 67 years old.  Wayne Beck uses a nasal mask.  Wayne Beck admits to 100% compliance.  An echocardiogram in September 2015 showed moderate left ventricular hypertrophy, severe, focal basal hypertrophy of the septum and an ejection fraction of 60-65%.  There was a 15 mm risk gradient which increased to 45 mm in the LVOT with Valsalva maneuver.  Wayne Beck had grade 1 diastolic dysfunction and a moderately dilated left atrium.  On 05/03/2015.  Wayne Beck called the answering service complaining of irregular heart rhythm with a heart rate in the 130's.  Wayne Beck had been on Toprol XL 150 mg twice a day per Dr. Mare Ferrari.  His heart rate eventually went back to normal.  The following day Wayne Beck had transient vision loss in the left side of his visual field.  Wayne Beck denied associated headache, unilateral numbness or weakness, facial droop, dizziness, or slowed speech.  His symptoms resolved after 4 minutes.  The following day Wayne Beck was seen in the office by Lavone Neri maintained and his aspirin was changed eliquis for anticoagulation therapy.  Wayne Beck underwent neurological evaluation by Dr. Tomi Likens.  His simvastatin was ultimately changed to atorvastatin 80 mg for high potency statin therapy.  Carotid Dopplers showed mild homogeneous plaque bilaterally without significant stenosis.  On 06/04/2015.  Wayne Beck was seen in the atrial fibrillation clinic.   At that time, Wayne Beck described his atrial fibrillation burden as approximate 6-8 episodes per where year, usually lasting 2 hours and typically responding to Cardizem 60 mg when necessary.  Wayne Beck was in sinus rhythm.  Wayne Beck is now referred for follow-up Cardiologic evaluation.  From a sleep perspective, Wayne Beck typically goes to bed between 11:30 and midnight and wakes up at 7:30 AM.  Wayne Beck wakes up several 2 in the night.  His machine is old.  An Epworth Sleepiness Scale score was calculated in the office today and this endorsed at 9 with his highest chance of dozing while lying down to rest in the afternoon when circumstances permit, a moderate chance of dozing as a passenger in a car mild chance of dozing while sitting and reading, watching TV, sitting inactive in a public place, and sitting quietly after lunch.  Past Medical History  Diagnosis Date  . Hypercholesteremia   . HTN (hypertension)   . Hypertrophic obstructive cardiomyopathy   . OSA on CPAP   . Erectile dysfunction     Past Surgical History  Procedure Laterality Date  . No past surgeries      No Known Allergies  Current Outpatient Prescriptions  Medication Sig Dispense Refill  . ALPRAZolam (XANAX) 0.25 MG tablet Take 1 tablet (0.25 mg total) by mouth at bedtime as needed for anxiety. PRIOR TO MEDICAL PROCEDURE FOR CLAUSTROPHOBIA 10 tablet 0  . apixaban (ELIQUIS) 5 MG TABS tablet Take 1 tablet (5 mg total) by mouth 2 (two) times daily. [place]  tablet 5  . atorvastatin (LIPITOR) 80 MG tablet Take 1 tablet (80 mg total) by mouth daily. 90 tablet 3  . diltiazem (CARDIZEM) 60 MG tablet 1 tablet at onset of AFib or as directed 30 tablet 1  . metoprolol succinate (TOPROL-XL) 100 MG 24 hr tablet TAKE 1 & 1/2 TABLETS BY MOUTH TWICE DAILY 90 tablet 11  . vardenafil (LEVITRA) 10 MG tablet Take 10 mg by mouth daily as needed for erectile dysfunction.     No current facility-administered medications for this visit.    Social History   Social History    . Marital Status: Single    Spouse Name: N/A  . Number of Children: N/A  . Years of Education: N/A   Occupational History  . Not on file.   Social History Main Topics  . Smoking status: Former Smoker    Types: Cigarettes    Quit date: 04/21/1971  . Smokeless tobacco: Not on file  . Alcohol Use: No  . Drug Use: No  . Sexual Activity: Yes   Other Topics Concern  . Not on file   Social History Narrative   Additional social history is notable in that Wayne Beck was born in [place].  Wayne Beck grew up in [place].  Wayne Beck attended [employer].  Wayne Beck works as a Engineer, maintenance (IT).  Family History  Problem Relation Age of Onset  . Other Father     CARDIOMEGALY  . Cancer Father   . ALS Mother   . Atrial fibrillation Brother   . Arrhythmia Sister     History of ablation and  . Heart attack Neg Hx   . Stroke Neg Hx   . Hypertension Father     ROS General: Negative; No fevers, chills, or night sweats; Positive for fatigue HEENT: Negative; No changes hearing, sinus congestion, difficulty swallowing; positive for deviated septum Pulmonary: Negative; No cough, wheezing, shortness of breath, hemoptysis Cardiovascular:  See HPI;  GI: Negative; No nausea, vomiting, diarrhea, or abdominal pain GU: Negative; No dysuria, hematuria, or difficulty voiding Musculoskeletal: Negative; no myalgias, joint pain, or weakness Hematologic/Oncologic: Negative; no easy bruising, bleeding Endocrine: Negative; no heat/cold intolerance; no diabetes Neuro: Recent transient loss of vision in the left side of his visual field Skin: Negative; No rashes or skin lesions Psychiatric: Negative; No behavioral problems, depression Sleep: Positive for a almost 20 year history of sleep apnea for which she's been on CPAP therapy.  Wayne Beck has not been evaluated in over 10 years., bruxism, restless legs, hypnogagnic hallucinations Other comprehensive 14 point system review is negative   Physical Exam BP 110/70 mmHg  Ht '6\' 2"'$  (1.88 m)  Wt  236 lb 12.8 oz (107.412 kg)  BMI 30.39 kg/m2  Wt Readings from Last 3 Encounters:  06/23/15 236 lb 12.8 oz (107.412 kg)  06/04/15 238 lb 3.2 oz (108.047 kg)  06/02/15 237 lb (107.502 kg)   General: Alert, oriented, no distress.  Skin: normal turgor, no rashes, warm and dry HEENT: Normocephalic, atraumatic. Pupils equal round and reactive to light; sclera anicteric; extraocular muscles intact; Fundi No hemorrhages or exudates Nose without nasal septal hypertrophy Mouth/Parynx benign; Mallinpatti scale 3/4 Neck: No JVD, no carotid bruits; normal carotid upstroke Lungs: clear to ausculatation and percussion; no wheezing or rales Chest wall: without tenderness to palpitation Heart: PMI not displaced, RRR with an occasional ectopic complex, s1 s2 normal, 1/6 systolic murmur which increases  with Valsalva maneuver, no diastolic murmur, no rubs, gallops, thrills, or heaves Abdomen: soft, nontender; no hepatosplenomehaly,  BS+; abdominal aorta nontender and not dilated by palpation. Back: no CVA tenderness Pulses 2+ Musculoskeletal: full range of motion, normal strength, no joint deformities Extremities: no clubbing cyanosis or edema, Homan's sign negative  Neurologic: grossly nonfocal; Cranial nerves grossly wnl Psychologic: Normal mood and affect   ECG (independently read by me): Sinus rhythm with occasional PVCs.  T-wave inversion V4 through V6, leads 1 and aVL and V2.  LABS:  BMP Latest Ref Rng 05/05/2015 01/03/2013 03/10/2012  Glucose 65 - 99 mg/dL 81 106(H) 158(H)  BUN 7 - 25 mg/dL _0 Creatinine 0.70 - 1.25 mg/dL 1.09 1.1 0.99  Sodium 135 - 146 mmol/L 139 140 137  Potassium 3.5 - 5.3 mmol/L 4.7 4.7 4.5  Chloride 98 - 110 mmol/L 105 106 102  CO2 20 - 31 mmol/L _1 Calcium 8.6 - 10.3 mg/dL 9.2 9.1 9.5     Hepatic Function Latest Ref Rng 06/02/2015 01/03/2013 12/08/2010  Total Protein 6.1 - 8.1 g/dL 6.3 6.9 6.8  Albumin 3.6 - 5.1 g/dL 4.0 3.9 3.9  AST 10 - 35 U/L _2 ALT 9 - 46 U/L _3 Alk Phosphatase 40 - 115 U/L 58 57 58  Total Bilirubin 0.2 - 1.2 mg/dL 0.6 0.5 0.7  Bilirubin, Direct <=0.2 mg/dL 0.1 0.1 0.1    CBC Latest Ref Rng 05/05/2015 03/10/2012  WBC 3.8 - 10.8 K/uL 7.0 7.5  Hemoglobin 13.2 - 17.1 g/dL 15.7 17.3(H)  Hematocrit 38.5 - 50.0 % 45.8 47.7  Platelets 140 - 400 K/uL 266 252   Lab Results  Component Value Date   MCV 92.2 05/05/2015   MCV 90.7 03/10/2012   Lab Results  Component Value Date   TSH 3.668 03/10/2012   No results found for: HGBA1C   BNP No results found for: BNP  ProBNP No results found for: PROBNP   Lipid Panel     Component Value Date/Time   CHOL 201* 05/12/2015 1012   TRIG 226* 05/12/2015 1012   HDL 35* 05/12/2015 1012   CHOLHDL 5.7* 05/12/2015 1012   VLDL 45* 05/12/2015 1012   LDLCALC 121 05/12/2015 1012   LDLDIRECT 121.4 01/03/2013 1015    RADIOLOGY: No results found.   ASSESSMENT AND PLAN: Mr. Shayden Bobier is a 65 year old Caucasian male who has a previous documentation of hypertrophic obstructive Carter myopathy.  An echo Doppler Beck in 2015 showed a resting gradient of 15 mm, which increased to 45 mm with Valsalva maneuver.  Wayne Beck did normal systolic function and evidence for severe focal basal hypertrophy.  Wayne Beck has a history of paroxysmal atrial fibrillation and has been maintained on high-dose metoprolol succinate 159 g twice a day.  Recently, Wayne Beck developed an episode of atrial fibrillation the following day had transient neurologic symptoms with transient visual field loss which ultimately resolved.  Wayne Beck has been taking diltiazem 60 mg on an as-needed basis with breakthrough PAF.  Presently, I am recommending that we obtain a repeat echo Doppler Beck to reassess potential progression of his HOCM and LVOT obstruction.  Since Wayne Beck has been on high-dose beta blocker therapy, and continues to have recurrent episodes which Wayne Beck thinks make her anywhere from every 6-8 weeks and last for 1-2 hours it may  be prudent to consider switching him to Norpace or possibly amiodarone in the future in light of his hypertrophic cardiomyopathy.  I did not change his medication today, but have suggested this to him and I will defer  change until his follow-up evaluations.  Wayne Beck has a history of long-standing sleep apnea and has not been evaluated in over 10 years.  His machine is old.  I have suggested a new sleep evaluation with a split-night Beck, after which Wayne Beck will be a candidate for a new machine and we will be able to ascertain if Wayne Beck is on optimal pressure.  I am recommending lab work be obtained in the fasting state.  Wayne Beck was recently changed to atorvastatin and the goal of therapy should be an LDL less than 70.  His prior lipid panel also suggested an atherogenic pattern with elevated triglycerides and VLDL.  Wayne Beck may benefit from the addition of omega-3 fatty acids.  His ECG shows T-wave abnormality , but this has been present on previous ECGs, which I have reviewed and is not significant change from the July 2015 tracing.  I will see him in 6 weeks for follow-up evaluation or sooner if problems arise.  Time spent: 45 minutes  Troy Sine, MD, [facility] 06/25/2015 9:10 PM

## 2015-07-07 ENCOUNTER — Other Ambulatory Visit: Payer: Self-pay

## 2015-07-07 ENCOUNTER — Ambulatory Visit (HOSPITAL_COMMUNITY): Payer: Medicare Other | Attending: Cardiology

## 2015-07-07 DIAGNOSIS — I421 Obstructive hypertrophic cardiomyopathy: Secondary | ICD-10-CM | POA: Diagnosis not present

## 2015-07-07 DIAGNOSIS — G4733 Obstructive sleep apnea (adult) (pediatric): Secondary | ICD-10-CM | POA: Insufficient documentation

## 2015-07-07 DIAGNOSIS — R011 Cardiac murmur, unspecified: Secondary | ICD-10-CM | POA: Diagnosis not present

## 2015-07-07 DIAGNOSIS — I119 Hypertensive heart disease without heart failure: Secondary | ICD-10-CM | POA: Insufficient documentation

## 2015-07-07 DIAGNOSIS — I340 Nonrheumatic mitral (valve) insufficiency: Secondary | ICD-10-CM | POA: Insufficient documentation

## 2015-07-07 DIAGNOSIS — I10 Essential (primary) hypertension: Secondary | ICD-10-CM

## 2015-07-07 DIAGNOSIS — E7800 Pure hypercholesterolemia, unspecified: Secondary | ICD-10-CM | POA: Diagnosis not present

## 2015-07-07 LAB — ECHOCARDIOGRAM COMPLETE
AO mean calculated velocity dopler: 182 cm/s
AOVTI: 53.5 cm
AV Mean grad: 15 mmHg
AV Peak grad: 26 mmHg
AV pk vel: 257 cm/s
E decel time: 243 msec
EERAT: 10.13
FS: 40 % (ref 28–44)
IVS/LV PW RATIO, ED: 1.19
LA ID, A-P, ES: 53 cm
LA diam index: 2.22 cm/m2
LA vol A4C: 70 ml
LA vol index: 28.8 mL/m2
LAVOL: 69 cm3
LDCA: 3.8 cm2
LEFT ATRIUM END SYS DIAM: 53 cm
LV E/e' medial: 10.13
LV E/e'average: 10.13
LV TDI E'LATERAL: 7.8
LV TDI E'MEDIAL: 6.67
LV e' LATERAL: 7.8 cm/s
LVOT diameter: 22 mm
MV Dec: 243
MV pk A vel: 80.1 m/s
MV pk E vel: 79 m/s
MVPG: 2 mmHg
PW: 14.1 mm — AB (ref 0.6–1.1)
RV LATERAL S' VELOCITY: 20.3 cm/s

## 2015-07-15 NOTE — Addendum Note (Signed)
Addended by: Evans LanceSTOVER, Ole Lafon W on: 07/15/2015 03:49 PM   Modules accepted: Orders

## 2015-08-10 ENCOUNTER — Ambulatory Visit (HOSPITAL_BASED_OUTPATIENT_CLINIC_OR_DEPARTMENT_OTHER): Payer: Medicare Other | Attending: Cardiovascular Disease | Admitting: Cardiovascular Disease

## 2015-08-10 VITALS — Ht 74.0 in | Wt 230.0 lb

## 2015-08-10 DIAGNOSIS — Z7901 Long term (current) use of anticoagulants: Secondary | ICD-10-CM | POA: Insufficient documentation

## 2015-08-10 DIAGNOSIS — G4733 Obstructive sleep apnea (adult) (pediatric): Secondary | ICD-10-CM | POA: Diagnosis not present

## 2015-08-10 DIAGNOSIS — R0683 Snoring: Secondary | ICD-10-CM | POA: Insufficient documentation

## 2015-08-10 DIAGNOSIS — Z79899 Other long term (current) drug therapy: Secondary | ICD-10-CM | POA: Diagnosis not present

## 2015-08-10 DIAGNOSIS — Z9989 Dependence on other enabling machines and devices: Secondary | ICD-10-CM

## 2015-08-10 DIAGNOSIS — I493 Ventricular premature depolarization: Secondary | ICD-10-CM | POA: Insufficient documentation

## 2015-08-10 DIAGNOSIS — I491 Atrial premature depolarization: Secondary | ICD-10-CM | POA: Diagnosis not present

## 2015-08-17 ENCOUNTER — Encounter (HOSPITAL_BASED_OUTPATIENT_CLINIC_OR_DEPARTMENT_OTHER): Payer: Self-pay | Admitting: Cardiovascular Disease

## 2015-08-17 NOTE — Procedures (Signed)
Patient Name: Wayne Beck, Wayne Beck Date: 08/10/2015 Gender: Male D.O.B: 09/14/1950 Age (years): 2 Referring Provider: Shelva Majestic MD, ABSM Height (inches): 74 Interpreting Physician: Shelva Majestic MD, ABSM Weight (lbs): 230 RPSGT: Madelon Lips BMI: 30 MRN: [id number] Neck Size: 17.00  CLINICAL INFORMATION Sleep Study Type: Split Night CPAP Indication for sleep study: OSA Epworth Sleepiness Score: 5  SLEEP STUDY TECHNIQUE As per the AASM Manual for the Scoring of Sleep and Associated Events v2.3 (April 2016) with a hypopnea requiring 4% desaturations. The channels recorded and monitored were frontal, central and occipital EEG, electrooculogram (EOG), submentalis EMG (chin), nasal and oral airflow, thoracic and abdominal wall motion, anterior tibialis EMG, snore microphone, electrocardiogram, and pulse oximetry. Continuous positive airway pressure (CPAP) was initiated when the patient met split night criteria and was titrated according to treat sleep-disordered breathing.  MEDICATIONS vardenafil (LEVITRA) 10 MG tablet 10 mg, Daily PRN     metoprolol succinate (TOPROL-XL) 100 MG 24 hr tablet      diltiazem (CARDIZEM) 60 MG tablet      atorvastatin (LIPITOR) 80 MG tablet 80 mg, Daily     apixaban (ELIQUIS) 5 MG TABS tablet 5 mg, 2 times daily     ALPRAZolam (XANAX) 0.25 MG tablet 0.25 mg, At bedtime PRN   Medications administered by patient during sleep study : No sleep medicine administered.  RESPIRATORY PARAMETERS Diagnostic Total AHI (/hr): 44.5 RDI (/hr): 53.1 OA Index (/hr): 5.7 CA Index (/hr): 16.7 REM AHI (/hr): N/A NREM AHI (/hr): 44.5 Supine AHI (/hr): 44.5 Non-supine AHI (/hr): N/A Min O2 Sat (%): 81.00 Mean O2 (%): 95.09 Time below 88% (min): 2.5    Titration Optimal Pressure (cm): 9 AHI at Optimal Pressure (/hr): 0.0 Min O2 at Optimal Pressure (%): 93.0 Supine % at Optimal (%): 100 Sleep % at Optimal (%): 88    SLEEP ARCHITECTURE The recording time for  the entire night was 390.3 minutes. During a baseline period of 182.1 minutes, the patient slept for 125.4 minutes in REM and nonREM, yielding a sleep efficiency of 68.9%. Sleep onset after lights out was 6.2 minutes with a REM latency of N/A minutes. The patient spent 34.62% of the night in stage N1 sleep, 65.38% in stage N2 sleep, 0.00% in stage N3 and 0.00% in REM.  Wake aftrer sleep onset (WASO) was 50.5 minutes.   During the titration period of 187.6 minutes, the patient slept for 140.5 minutes in REM and nonREM, yielding a sleep efficiency of 74.9%. Sleep onset after CPAP initiation was 23.6 minutes with a REM latency of 51.5 minutes. The patient spent 18.15% of the night in stage N1 sleep, 67.97% in stage N2 sleep, 0.00% in stage N3 and 13.88% in REM.  CARDIAC DATA The 2 lead EKG demonstrated sinus rhythm. The mean heart rate was 46.69 beats per minute. Other EKG findings include: PVCs and PACs.  LEG MOVEMENT DATA The total Periodic Limb Movements of Sleep (PLMS) were 174. The PLMS index was 38.96.  IMPRESSIONS - Severe obstructive sleep apnea occurred during the diagnostic portion of the study (AHI = 44.5/hour). An optimal PAP pressure was selected for this patient ( 9 cm of water). - Mild central sleep apnea occurred during the diagnostic portion of the study (CAI = 16.7/hour). - Abnormal sleep architecture with absence of slow wave and REM sleep on the diagnostic portion of the study. - Oxygen desaturation to a  nadir of 81.00%. - The patient snored with Moderate snoring volume during the diagnostic portion  of the study. - EKG findings include PVCs and PACs. - Mild periodic limb movements of sleep occurred during the study.  DIAGNOSIS - Obstructive Sleep Apnea (327.23 [G47.33 ICD-10])  RECOMMENDATIONS - Recommend an initial trial of CPAP therapy on 9 cm H2O with heated humidification.  A Large size Fisher&Paykel Nasal Mask Eson mask was used for the titration. - Efforts should be  made to optimize nasal and oropharyngeal patency. - Avoid alcohol, sedatives and other CNS depressants that may worsen sleep apnea and disrupt normal sleep architecture. - Sleep hygiene should be reviewed to assess factors that may improve sleep quality. - Weight management and regular exercise should be initiated or continued. - Recommend a download be obtained in 30 days and sleep clinic follow-up evaluation.  [Electronically signed] 08/17/2015 10:32 PM  Shelva Majestic MD, [facility], [employer], American Board of Sleep Medicine  NPI: [id number] [place] PH: [phone]   FX: [phone] [place]

## 2015-08-21 ENCOUNTER — Other Ambulatory Visit: Payer: Self-pay | Admitting: *Deleted

## 2015-08-21 ENCOUNTER — Telehealth: Payer: Self-pay | Admitting: *Deleted

## 2015-08-21 ENCOUNTER — Telehealth: Payer: Self-pay | Admitting: Cardiovascular Disease

## 2015-08-21 DIAGNOSIS — Z9989 Dependence on other enabling machines and devices: Principal | ICD-10-CM

## 2015-08-21 DIAGNOSIS — G4733 Obstructive sleep apnea (adult) (pediatric): Secondary | ICD-10-CM

## 2015-08-21 NOTE — Progress Notes (Signed)
Patient notified. Order sent to  [facility]. 

## 2015-08-21 NOTE — Telephone Encounter (Signed)
Left message that his sleep study has been read. Orders were sent to [facility] for processing. Call back if questions.

## 2015-08-21 NOTE — Telephone Encounter (Signed)
Returned a call to patient. Reviewed his sleep study results and recommendations. Answered all questions satisfactorily.

## 2015-08-21 NOTE — Telephone Encounter (Signed)
F/u  Pt returning rn phone call. Please call back and discuss.   

## 2015-09-02 ENCOUNTER — Telehealth: Payer: Self-pay

## 2015-09-02 DIAGNOSIS — I63429 Cerebral infarction due to embolism of unspecified anterior cerebral artery: Secondary | ICD-10-CM

## 2015-09-02 DIAGNOSIS — E785 Hyperlipidemia, unspecified: Secondary | ICD-10-CM

## 2015-09-02 NOTE — Telephone Encounter (Signed)
-----   Message from Richarda Overlie, [place] sent at 06/02/2015  8:57 AM EDT ----- LFT

## 2015-09-02 NOTE — Telephone Encounter (Signed)
Left message for patient to return call.

## 2015-09-02 NOTE — Telephone Encounter (Signed)
From A&P from OV on 06/02/15  "2.  Ideally, I would like to switch to a high-potency statin, such as Lipitor 80mg  daily.  However, with concomitant diltiazem, he may have increased risk for myopathy.  I will have him continue simvastatin 40mg  daily for now and consult with cardiology regarding if we could make the switch.  LDL goal should be less than 70).  Recheck LDL in 3 months.  Will also check baseline LFTs."  Will call patient to see if he can come have labs drawn after 9 a.m.

## 2015-10-03 ENCOUNTER — Ambulatory Visit (INDEPENDENT_AMBULATORY_CARE_PROVIDER_SITE_OTHER): Payer: Medicare Other | Admitting: Cardiovascular Disease

## 2015-10-03 ENCOUNTER — Encounter: Payer: Self-pay | Admitting: Cardiovascular Disease

## 2015-10-03 ENCOUNTER — Telehealth: Payer: Self-pay | Admitting: Cardiovascular Disease

## 2015-10-03 VITALS — BP 98/63 | HR 49 | Ht 74.0 in | Wt 233.0 lb

## 2015-10-03 DIAGNOSIS — I10 Essential (primary) hypertension: Secondary | ICD-10-CM

## 2015-10-03 DIAGNOSIS — I421 Obstructive hypertrophic cardiomyopathy: Secondary | ICD-10-CM | POA: Diagnosis not present

## 2015-10-03 DIAGNOSIS — I6523 Occlusion and stenosis of bilateral carotid arteries: Secondary | ICD-10-CM

## 2015-10-03 DIAGNOSIS — G4733 Obstructive sleep apnea (adult) (pediatric): Secondary | ICD-10-CM

## 2015-10-03 DIAGNOSIS — I480 Paroxysmal atrial fibrillation: Secondary | ICD-10-CM

## 2015-10-03 DIAGNOSIS — E785 Hyperlipidemia, unspecified: Secondary | ICD-10-CM

## 2015-10-03 DIAGNOSIS — Z7901 Long term (current) use of anticoagulants: Secondary | ICD-10-CM

## 2015-10-03 MED ORDER — METOPROLOL SUCCINATE ER 100 MG PO TB24: ORAL_TABLET | ORAL | 11 refills | Status: DC

## 2015-10-03 MED ORDER — ATORVASTATIN CALCIUM 80 MG PO TABS: 40.0000 mg | ORAL_TABLET | Freq: Every day | ORAL | 3 refills | Status: AC

## 2015-10-03 NOTE — Telephone Encounter (Signed)
Spoke w/ Dr. Tresa EndoKelly. Recommendation given by physician, if BP is still low (<100 systolic) hold tonight's metoprolol dose, if above, give 50mg  only.  Reported recommendations to patient and wife, who verbalized understanding.  They asked what to do under contingency patient failed to improve/had continued low BPs/dizziness/N&V Advised typically best option would be to present to ED for eval, labwork, replacement of fluids via IV, etc. Noted patient and wife reluctant to pursue this option but I did remind them this is an appropriate action. They verbalized understanding and thanks.

## 2015-10-03 NOTE — Patient Instructions (Signed)
Your physician has recommended you make the following change in your medication:   1.) The metoprolol succ and atorvastatin has been changed. Refer to medication sheet.   Your physician recommends that you schedule a follow-up appointment in: 2-3 months with Dr Tresa EndoKelly for sleep . ( this can be on a regular day instead of a sleep day.)  Your physician recommends that you return for lab work prior to your nest appointment.

## 2015-10-03 NOTE — Telephone Encounter (Signed)
Returned call.  Patient has had some dizziness and vomiting today. No BPs to report from home; he was at [facility] w Dr. Tresa EndoKelly this AM and BP was low then (98/63)  Notes he had "a little" dizziness this AM but it is noticeably worse now. Was recommended OTC med by Dr. Tresa EndoKelly if dizziness continued - could not recall name of med.  Discussed was likely a motion sickness med so I will confirm w. Dr. Tresa EndoKelly (meclizine?)  He denies chest pain, syncope, shortness of breath. Aware I will defer to Dr. Tresa EndoKelly. Noted med changes were already made at appt, including reduction of his metoprolol dose, which should help w/ his avg BPs but will take several days to do so. .Marland Kitchen

## 2015-10-03 NOTE — Telephone Encounter (Signed)
Follow up        Returning a call to the nurse.  Since earlier phone call pt had vomited twice and now is really dizzy.  Please call

## 2015-10-03 NOTE — Telephone Encounter (Signed)
No answer when dialed. Left msg for patient to call. 

## 2015-10-03 NOTE — Telephone Encounter (Signed)
New Message  Pt voiced he had an appt this morning and pt voiced he's not feeling well.  Please follow up with pt. Thanks!

## 2015-10-05 NOTE — Progress Notes (Signed)
Patient ID: Wayne Beck, male   DOB: 05/13/1950, 65 y.o.   MRN: [id number]     Primary MD: none  PATIENT PROFILE: Wayne Beck is a 65 y.o. male who is a former patient of Dr. Mare Ferrari.  He was referred from the atrial fibrillation clinic for reestablishment of cardiology care and also for evaluation of sleep apnea.  I saw him for initial evaluation in May 2017.  He presents now for follow-up evaluation   HPI:  [name] has a history of hypertrophic obstructive cardiomyopathy, paroxysmal atrial fibrillation, hypertension, hyperlipidemia, and obstructive sleep apnea.  He states that he was diagnosed with sleep apnea almost 20 years ago.  He is on his second machine and his most recent machine is at least 67 years old.  He uses a nasal mask.  He admits to 100% compliance.  An echocardiogram in September 2015 showed moderate left ventricular hypertrophy, severe, focal basal hypertrophy of the septum and an ejection fraction of 60-65%.  There was a 15 mm risk gradient which increased to 45 mm in the LVOT with Valsalva maneuver.  He had grade 1 diastolic dysfunction and a moderately dilated left atrium.  On 05/03/2015 he called the answering service complaining of irregular heart rhythm with a heart rate in the 130's.  He had been on Toprol XL 150 mg twice a day per Dr. Mare Ferrari.  His heart rate eventually went back to normal.  The following day he had transient vision loss in the left side of his visual field.  He denied associated headache, unilateral numbness or weakness, facial droop, dizziness, or slowed speech.  His symptoms resolved after 4 minutes.  The following day he was seen in the office by Lavone Neri maintained and his aspirin was changed eliquis for anticoagulation therapy.  He underwent neurological evaluation by Dr. Tomi Likens.  His simvastatin was ultimately changed to atorvastatin 80 mg for high potency statin therapy.  Carotid Dopplers showed mild homogeneous plaque bilaterally without  significant stenosis.  On 06/04/2015.  He was seen in the atrial fibrillation clinic.  At that time, he described his atrial fibrillation burden as approximate 6-8 episodes per where year, usually lasting 2 hours and typically responding to Cardizem 60 mg when necessary.  He was in sinus rhythm.    From a sleep perspective, he typically goes to bed between 11:30 and midnight and wakes up at 7:30 AM.  He wakes up several 2 in the night.  His machine is old.  An Epworth Sleepiness Scale score was calculated in the office today and this endorsed at 9 with his highest chance of dozing while lying down to rest in the afternoon when circumstances permit, a moderate chance of dozing as a passenger in a car mild chance of dozing while sitting and reading, watching TV, sitting inactive in a public place, and sitting quietly after lunch.  When I saw him, we discussed his medical therapy for his HOCM and recommended a follow-up echo Doppler study for reassessment.  I also scheduled him for a sleep study which was done on 08/10/2015.  This confirmed severe obstructive sleep apnea with an AHI of 44.5 per hour.  He also had mild central apnea during the diagnostic portion of the study at 16.7 per hour.  Sleep architecture was abnormal with absence of slow wave and rim sleep.  His oxygen desaturated to 81%.  There was moderate snoring.  He had occasional PACs and PVCs throughout the study and had mild periodic limb  movements.  He was titrated up to 9 cm water pressure.  Due to his vacation, he ultimately did not start CPAP therapy until 09/29/2015 when [facility]  He admits to some fatigue.  His blood pressure has been low.  He presents for evaluation.  Past Medical History:  Diagnosis Date  . Erectile dysfunction   . HTN (hypertension)   . Hypercholesteremia   . Hypertrophic obstructive cardiomyopathy   . OSA on CPAP     Past Surgical History:  Procedure Laterality Date    . NO PAST SURGERIES      No Known Allergies  Current Outpatient Prescriptions  Medication Sig Dispense Refill  . ALPRAZolam (XANAX) 0.25 MG tablet Take 1 tablet (0.25 mg total) by mouth at bedtime as needed for anxiety. PRIOR TO MEDICAL PROCEDURE FOR CLAUSTROPHOBIA 10 tablet 0  . apixaban (ELIQUIS) 5 MG TABS tablet Take 1 tablet (5 mg total) by mouth 2 (two) times daily. 60 tablet 5  . atorvastatin (LIPITOR) 80 MG tablet Take 0.5 tablets (40 mg total) by mouth daily. 90 tablet 3  . diltiazem (CARDIZEM) 60 MG tablet 1 tablet at onset of AFib or as directed 30 tablet 1  . metoprolol succinate (TOPROL-XL) 100 MG 24 hr tablet TAKE 1 & 1/2 TABLET IN THE MORNING AND 1 TABLET AT NIGHT. 90 tablet 11  . vardenafil (LEVITRA) 10 MG tablet Take 10 mg by mouth daily as needed for erectile dysfunction.     No current facility-administered medications for this visit.     Social History   Social History  . Marital status: Single    Spouse name: N/A  . Number of children: N/A  . Years of education: N/A   Occupational History  . Not on file.   Social History Main Topics  . Smoking status: Former Smoker    Types: Cigarettes    Quit date: 04/21/1971  . Smokeless tobacco: Not on file  . Alcohol use No  . Drug use: No  . Sexual activity: Yes   Other Topics Concern  . Not on file   Social History Narrative  . No narrative on file   Additional social history is notable in that he was born in [place].  He grew up in [place].  He attended [employer].  He works as a Engineer, maintenance (IT).  Family History  Problem Relation Age of Onset  . Other Father     CARDIOMEGALY  . Cancer Father   . Hypertension Father   . ALS Mother   . Atrial fibrillation Brother   . Arrhythmia Sister     History of ablation and  . Heart attack Neg Hx   . Stroke Neg Hx     ROS General: Negative; No fevers, chills, or night sweats; Positive for fatigue HEENT: Negative; No changes hearing, sinus congestion, difficulty  swallowing; positive for deviated septum Pulmonary: Negative; No cough, wheezing, shortness of breath, hemoptysis Cardiovascular:  See HPI;  GI: Negative; No nausea, vomiting, diarrhea, or abdominal pain GU: Negative; No dysuria, hematuria, or difficulty voiding Musculoskeletal: Negative; no myalgias, joint pain, or weakness Hematologic/Oncologic: Negative; no easy bruising, bleeding Endocrine: Negative; no heat/cold intolerance; no diabetes Neuro: Recent transient loss of vision in the left side of his visual field Skin: Negative; No rashes or skin lesions Psychiatric: Negative; No behavioral problems, depression Sleep: Positive for a almost 20 year history of sleep apnea for which she's been on CPAP therapy.  He has not  been evaluated in over 10 years., bruxism, restless legs, hypnogagnic hallucinations Other comprehensive 14 point system review is negative   Physical Exam BP 98/63 (BP Location: Left Arm, Patient Position: Sitting, Cuff Size: Normal)   Pulse (!) 49   Ht '6\' 2"'  (1.88 m)   Wt 233 lb (105.7 kg)   BMI 29.92 kg/m    Repeat blood pressure by me was 104/70.  Pulse was 48.  Wt Readings from Last 3 Encounters:  10/03/15 233 lb (105.7 kg)  08/10/15 230 lb (104.3 kg)  06/23/15 236 lb 12.8 oz (107.4 kg)   General: Alert, oriented, no distress.  Skin: normal turgor, no rashes, warm and dry HEENT: Normocephalic, atraumatic. Pupils equal round and reactive to light; sclera anicteric; extraocular muscles intact; Fundi No hemorrhages or exudates Nose without nasal septal hypertrophy Mouth/Parynx benign; Mallinpatti scale 3/4 Neck: No JVD, no carotid bruits; normal carotid upstroke Lungs: clear to ausculatation and percussion; no wheezing or rales Chest wall: without tenderness to palpitation Heart: PMI not displaced, RRR with an occasional ectopic complex, s1 s2 normal, 1/6 systolic murmur which increases  with Valsalva maneuver, no diastolic murmur, no rubs, gallops, thrills,  or heaves Abdomen: soft, nontender; no hepatosplenomehaly, BS+; abdominal aorta nontender and not dilated by palpation. Back: no CVA tenderness Pulses 2+ Musculoskeletal: full range of motion, normal strength, no joint deformities Extremities: no clubbing cyanosis or edema, Homan's sign negative  Neurologic: grossly nonfocal; Cranial nerves grossly wnl Psychologic: Normal mood and affect   ECG (independently read by me): Sinus bradycardia 49 bpm.  Previously noted ST-T changes inferolaterally.  QTc interval 426 ms.  ECG (independently read by me): Sinus rhythm with occasional PVCs.  T-wave inversion V4 through V6, leads 1 and aVL and V2.  LABS:  BMP Latest Ref Rng & Units 05/05/2015 01/03/2013 03/10/2012  Glucose 65 - 99 mg/dL 81 106(H) 158(H)  BUN 7 - 25 mg/dL '15 12 13  ' Creatinine 0.70 - 1.25 mg/dL 1.09 1.1 0.99  Sodium 135 - 146 mmol/L 139 140 137  Potassium 3.5 - 5.3 mmol/L 4.7 4.7 4.5  Chloride 98 - 110 mmol/L 105 106 102  CO2 20 - 31 mmol/L '26 27 26  ' Calcium 8.6 - 10.3 mg/dL 9.2 9.1 9.5     Hepatic Function Latest Ref Rng & Units 06/02/2015 01/03/2013 12/08/2010  Total Protein 6.1 - 8.1 g/dL 6.3 6.9 6.8  Albumin 3.6 - 5.1 g/dL 4.0 3.9 3.9  AST 10 - 35 U/L '17 21 21  ' ALT 9 - 46 U/L '21 29 27  ' Alk Phosphatase 40 - 115 U/L 58 57 58  Total Bilirubin 0.2 - 1.2 mg/dL 0.6 0.5 0.7  Bilirubin, Direct <=0.2 mg/dL 0.1 0.1 0.1    CBC Latest Ref Rng & Units 05/05/2015 03/10/2012  WBC 3.8 - 10.8 K/uL 7.0 7.5  Hemoglobin 13.2 - 17.1 g/dL 15.7 17.3(H)  Hematocrit 38.5 - 50.0 % 45.8 47.7  Platelets 140 - 400 K/uL 266 252   Lab Results  Component Value Date   MCV 92.2 05/05/2015   MCV 90.7 03/10/2012   Lab Results  Component Value Date   TSH 3.668 03/10/2012   No results found for: HGBA1C   BNP No results found for: BNP  ProBNP No results found for: PROBNP   Lipid Panel     Component Value Date/Time   CHOL 201 (H) 05/12/2015 1012   TRIG 226 (H) 05/12/2015 1012   HDL 35 (L)  05/12/2015 1012   CHOLHDL 5.7 (H) 05/12/2015 1012  VLDL 45 (H) 05/12/2015 1012   LDLCALC 121 05/12/2015 1012   LDLDIRECT 121.4 01/03/2013 1015    RADIOLOGY: No results found.  ------------------------------------------------------------------- 6/5/107 ECHO Study Conclusions  - Left ventricle: The cavity size was normal. There was mild focal   basal hypertrophy of the septum. Systolic function was normal.   The estimated ejection fraction was in the range of 60% to 65%.   Peak LV outflow tract gradient 24 mmHg. Wall motion was normal;   there were no regional wall motion abnormalities. Doppler   parameters are consistent with abnormal left ventricular   relaxation (grade 1 diastolic dysfunction). - Mitral valve: There was mitral valve systolic anterior motion   noted. There was mild regurgitation. - Left atrium: The atrium was mildly dilated. - Right ventricle: The cavity size was normal. Systolic function   was normal. - Pulmonary arteries: No complete TR doppler jet so unable to   estimate PA systolic pressure. - Inferior vena cava: The vessel was normal in size. The   respirophasic diameter changes were in the normal range (>= 50%),   consistent with normal central venous pressure.  Impressions:  - Normal LV size with mild focal basal septal hypertrophy. There   was LV outflow tract turbulence with peak gradient across the   LVOT of 24 mmHg. Normal RV size and systolic function. There was   mitral valve systolic anterior motion with mild MR. This study is   suggestive of hypertrophic cardiomyopathy.  ASSESSMENT AND PLAN: Mr. Montey Ebel is a 65 year old Caucasian male who has a previous documentation of hypertrophic obstructive cardio myopathy.  An echo Doppler study in 2015 showed a resting gradient of 15 mm, which increased to 45 mm with Valsalva maneuver.  He had normal systolic function and evidence for severe focal basal hypertrophy.  He has a history of paroxysmal  atrial fibrillation and has been maintained on high-dose metoprolol succinate 150 mg twice a day.  Recently, he developed an episode of atrial fibrillation the following day had transient neurologic symptoms with transient visual field loss which ultimately resolved.  He has been taking diltiazem 60 mg on an as-needed basis with breakthrough PAF.  Since I last saw him, he had a follow-up echo Doppler study, which again confirmed normal LV size with mild focal basal septal hypertrophy.  There was an LVOT gradient of 24 mm suggestive of his hypertrophic cardiomyopathy.  His last echo in 2015 showed a 10-15 mm resting gradient which increased to 45 mg with Valsalva.  I do not believe that the Valsalva maneuver was done at his most recent echo.  There is evidence for systolic anterior motion of his mitral valve with mild MR.  His blood pressure today is low and his pulse is bradycardic in the 40s.  He has not been aware of any recent recurrent AF episodes.  He continues to be on eliquis for anticoagulation and denies bleeding.  I have suggested a slight reduction of his Toprol-XL dose from instead of 150 mg twice a day to just 150 mg in the morning and 100 mg at night.  If he does have recurrent issues of PAF, he may be a candidate for Norpace or amiodarone.  I also reviewed his sleep study in detail with him.  This confirmed severe sleep apnea.  He has just received his CPAP machine over the past week.  Will need to obtain a download after 30 days to assess efficacy as well as compliance.  He also admits to  occasional indigestion.  I have suggested over-the-counter ranitidine at 75 or 100 mg daily.  I also reviewed recent blood work.  He had suddenly not been taking atorvastatin have suggested resuming this at 40 mg dosing.  He will undergo repeat blood work in a proximally 6-8 weeks.  I will see him back in the office in a sleep clinic following initiation of CPAP therapy per Medicare requirements.    Time spent: 25  minutes  Troy Sine, MD, [facility] 10/05/2015 5:52 PM

## 2015-10-30 ENCOUNTER — Encounter: Payer: Self-pay | Admitting: Cardiovascular Disease

## 2015-11-10 ENCOUNTER — Other Ambulatory Visit: Payer: Self-pay | Admitting: Cardiovascular Disease

## 2015-12-03 ENCOUNTER — Ambulatory Visit: Payer: Medicare Other | Admitting: Neurology

## 2015-12-03 DIAGNOSIS — Z029 Encounter for administrative examinations, unspecified: Secondary | ICD-10-CM

## 2015-12-09 ENCOUNTER — Ambulatory Visit (INDEPENDENT_AMBULATORY_CARE_PROVIDER_SITE_OTHER): Payer: Medicare Other | Admitting: Cardiovascular Disease

## 2015-12-09 ENCOUNTER — Encounter: Payer: Self-pay | Admitting: Cardiovascular Disease

## 2015-12-09 VITALS — BP 137/80 | HR 49 | Ht 74.0 in | Wt 236.0 lb

## 2015-12-09 DIAGNOSIS — I480 Paroxysmal atrial fibrillation: Secondary | ICD-10-CM

## 2015-12-09 DIAGNOSIS — K30 Functional dyspepsia: Secondary | ICD-10-CM

## 2015-12-09 DIAGNOSIS — I6523 Occlusion and stenosis of bilateral carotid arteries: Secondary | ICD-10-CM

## 2015-12-09 DIAGNOSIS — R011 Cardiac murmur, unspecified: Secondary | ICD-10-CM | POA: Diagnosis not present

## 2015-12-09 DIAGNOSIS — E782 Mixed hyperlipidemia: Secondary | ICD-10-CM | POA: Diagnosis not present

## 2015-12-09 DIAGNOSIS — I10 Essential (primary) hypertension: Secondary | ICD-10-CM | POA: Diagnosis not present

## 2015-12-09 DIAGNOSIS — I421 Obstructive hypertrophic cardiomyopathy: Secondary | ICD-10-CM

## 2015-12-09 DIAGNOSIS — Z79899 Other long term (current) drug therapy: Secondary | ICD-10-CM

## 2015-12-09 DIAGNOSIS — G4733 Obstructive sleep apnea (adult) (pediatric): Secondary | ICD-10-CM

## 2015-12-09 DIAGNOSIS — R0789 Other chest pain: Secondary | ICD-10-CM

## 2015-12-09 MED ORDER — METOPROLOL SUCCINATE ER 100 MG PO TB24: ORAL_TABLET | ORAL | 11 refills | Status: AC

## 2015-12-09 NOTE — Patient Instructions (Signed)
Your physician has recommended you make the following change in your medication:   1.) your night time dose of metoprolol has been decreased to 50 mg. Continue the morning dose the same.   Your physician recommends that you return for lab work in: FASTING.  Your physician has requested that you have en exercise stress myoview. HOLD the metoprolol the day before your stress test and the morning of. Resume your regular dose the night of the stress test.  Your physician recommends that you schedule a follow-up appointment in: 6-8 weeks.

## 2015-12-10 NOTE — Progress Notes (Signed)
Patient ID: Wayne Beck, male   DOB: Jan 06, 1951, 65 y.o.   MRN: [id number]     Primary MD: none  PATIENT PROFILE: Wayne Beck is a 65 y.o. male who is a former patient of Dr. Mare Ferrari.  He was referred from the atrial fibrillation clinic for reestablishment of cardiology care and also for evaluation of sleep apnea.  I saw him for initial evaluation in May 2017, and last saw him 2 months ago.  He presents for reevaluation.   HPI:  Wayne Beck has a history of hypertrophic obstructive cardiomyopathy, paroxysmal atrial fibrillation, hypertension, hyperlipidemia, and obstructive sleep apnea.  He states that he was diagnosed with sleep apnea almost 20 years ago.  He is on his second machine and his most recent machine is at least 67 years old.  He uses a nasal mask.  He admits to 100% compliance.  An echocardiogram in September 2015 showed moderate left ventricular hypertrophy, severe, focal basal hypertrophy of the septum and an ejection fraction of 60-65%.  There was a 15 mm risk gradient which increased to 45 mm in the LVOT with Valsalva maneuver.  He had grade 1 diastolic dysfunction and a moderately dilated left atrium.  On 05/03/2015 he called the answering service complaining of irregular heart rhythm with a heart rate in the 130's.  He had been on Toprol XL 150 mg twice a day per Dr. Mare Ferrari.  His heart rate eventually went back to normal.  The following day he had transient vision loss in the left side of his visual field.  He denied associated headache, unilateral numbness or weakness, facial droop, dizziness, or slowed speech.  His symptoms resolved after 4 minutes.  The following day he was seen in the office by Lavone Neri maintained and his aspirin was changed eliquis for anticoagulation therapy.  He underwent neurological evaluation by Dr. Tomi Likens.  His simvastatin was ultimately changed to atorvastatin 80 mg for high potency statin therapy.  Carotid Dopplers showed mild homogeneous plaque  bilaterally without significant stenosis.  On 06/04/2015 he was seen in the atrial fibrillation clinic.  At that time, he described his atrial fibrillation burden as approximate 6-8 episodes per where year, usually lasting 2 hours and typically responding to Cardizem 60 mg when necessary.  He was in sinus rhythm.    From a sleep perspective, he typically goes to bed between 11:30 and midnight and wakes up at 7:30 AM.  He wakes up several 2 in the night.  His machine is old.  An Epworth Sleepiness Scale score was calculated in the office today and this endorsed at 9 with his highest chance of dozing while lying down to rest in the afternoon when circumstances permit, a moderate chance of dozing as a passenger in a car mild chance of dozing while sitting and reading, watching TV, sitting inactive in a public place, and sitting quietly after lunch.  When I saw him, we discussed his medical therapy for his HOCM and recommended a follow-up echo Doppler study for reassessment.  His echo Doppler study was done on 07/07/2015 showed an EF of 60-65%.  Peak LV outflow tract gradient was 24 mm.  There was grade 1 diastolic dysfunction.  He had systolic anterior motion of the mitral valve with mild MR.  His left atrium was mildly dilated.  Normal pulmonary pressures.    I  scheduled him for a sleep study which was done on 08/10/2015.  This confirmed severe obstructive sleep apnea with an AHI of  44.5 per hour.  He also had mild central apnea during the diagnostic portion of the study at 16.7 per hour.  Sleep architecture was abnormal with absence of slow wave and rim sleep.  His oxygen desaturated to 81%.  There was moderate snoring.  He had occasional PACs and PVCs throughout the study and had mild periodic limb movements.  He was titrated up to 9 cm water pressure.  Due to his vacation, he ultimately did not start CPAP therapy until 09/29/2015.   I was able to obtain a download of his inner sense 10 ResMed CPAP unit  from 10/04/2015 through 12/02/2015.  Usage stays is 98% and usage greater than 4 hours was 93%.  At a set pressure 9 cm, AHI was excellent at 2.0.  He uses a nasal mask.  Inside last saw him, he has experienced occasional episodes of chest tightness and he also has experienced some episodes of indigestion.  He does admit to some abdominal bloating and gas.  He notes the chest pressure with activity.  Review of his records reveals that he has never had a nuclear stress test.  He presents for evaluation.  Past Medical History:  Diagnosis Date  . Erectile dysfunction   . HTN (hypertension)   . Hypercholesteremia   . Hypertr obst cardiomyop   . OSA on CPAP     Past Surgical History:  Procedure Laterality Date  . NO PAST SURGERIES      No Known Allergies  Current Outpatient Prescriptions  Medication Sig Dispense Refill  . ALPRAZolam (XANAX) 0.25 MG tablet Take 1 tablet (0.25 mg total) by mouth at bedtime as needed for anxiety. PRIOR TO MEDICAL PROCEDURE FOR CLAUSTROPHOBIA 10 tablet 0  . atorvastatin (LIPITOR) 80 MG tablet Take 0.5 tablets (40 mg total) by mouth daily. 90 tablet 3  . diltiazem (CARDIZEM) 60 MG tablet 1 tablet at onset of AFib or as directed 30 tablet 1  . ELIQUIS 5 MG TABS tablet TAKE 1 TABLET (5 MG TOTAL) BY MOUTH 2 (TWO) TIMES DAILY. 60 tablet 5  . metoprolol succinate (TOPROL-XL) 100 MG 24 hr tablet TAKE 1 & 1/2 TABLET IN THE MORNING AND 1/2 TABLET AT NIGHT. 90 tablet 11  . vardenafil (LEVITRA) 10 MG tablet Take 10 mg by mouth daily as needed for erectile dysfunction.     No current facility-administered medications for this visit.     Social History   Social History  . Marital status: Single    Spouse name: N/A  . Number of children: N/A  . Years of education: N/A   Occupational History  . Not on file.   Social History Main Topics  . Smoking status: Former Smoker    Types: Cigarettes    Quit date: 04/21/1971  . Smokeless tobacco: Not on file  . Alcohol  use No  . Drug use: No  . Sexual activity: Yes   Other Topics Concern  . Not on file   Social History Narrative  . No narrative on file   Additional social history is notable in that he was born in [place].  He grew up in [place].  He attended [employer].  He works as a Engineer, maintenance (IT).  Family History  Problem Relation Age of Onset  . Other Father     CARDIOMEGALY  . Cancer Father   . Hypertension Father   . ALS Mother   . Atrial fibrillation Brother   . Arrhythmia Sister     History of ablation and  .  Heart attack Neg Hx   . Stroke Neg Hx     ROS General: Negative; No fevers, chills, or night sweats; Positive for fatigue HEENT: Negative; No changes hearing, sinus congestion, difficulty swallowing; positive for deviated septum Pulmonary: Negative; No cough, wheezing, shortness of breath, hemoptysis Cardiovascular:  See HPI;  GI: Negative; No nausea, vomiting, diarrhea, or abdominal pain GU: Negative; No dysuria, hematuria, or difficulty voiding Musculoskeletal: Negative; no myalgias, joint pain, or weakness Hematologic/Oncologic: Negative; no easy bruising, bleeding Endocrine: Negative; no heat/cold intolerance; no diabetes Neuro: Recent transient loss of vision in the left side of his visual field Skin: Negative; No rashes or skin lesions Psychiatric: Negative; No behavioral problems, depression Sleep: Positive for a almost 20 year history of sleep apnea for which she's been on CPAP therapy.  He has not been evaluated in over 10 years., bruxism, restless legs, hypnogagnic hallucinations Other comprehensive 14 point system review is negative   Physical Exam BP 137/80   Pulse (!) 49   Ht '6\' 2"'  (1.88 m)   Wt 236 lb (107 kg)   BMI 30.30 kg/m    Repeat blood pressure by me was 104/70.  Pulse was 48.  Wt Readings from Last 3 Encounters:  12/09/15 236 lb (107 kg)  10/03/15 233 lb (105.7 kg)  08/10/15 230 lb (104.3 kg)   General: Alert, oriented, no distress.  Skin:  normal turgor, no rashes, warm and dry HEENT: Normocephalic, atraumatic. Pupils equal round and reactive to light; sclera anicteric; extraocular muscles intact; Fundi No hemorrhages or exudates Nose without nasal septal hypertrophy Mouth/Parynx benign; Mallinpatti scale 3/4 Neck: No JVD, no carotid bruits; normal carotid upstroke Lungs: clear to ausculatation and percussion; no wheezing or rales Chest wall: without tenderness to palpitation Heart: PMI not displaced, RRR with an occasional ectopic complex, s1 s2 normal, 1/6 systolic murmur which increases  with Valsalva maneuver along the left sternal border, also a 1/6 systolic murmur at the apex., no diastolic murmur, no rubs, gallops, thrills, or heaves';  Abdomen: soft, nontender; no hepatosplenomehaly, BS+; abdominal aorta nontender and not dilated by palpation. Back: no CVA tenderness Pulses 2+ Musculoskeletal: full range of motion, normal strength, no joint deformities Extremities: no clubbing cyanosis or edema, Homan's sign negative  Neurologic: grossly nonfocal; Cranial nerves grossly wnl Psychologic: Normal mood and affect  ECG (independently read by me): Sinus bradycardia 46 bpm.  Previously noted ST-T wave abnormalities inferolaterally.  QTc interval 407 ms.  10/03/2015 ECG (independently read by me): Sinus bradycardia 49 bpm.  Previously noted ST-T changes inferolaterally.  QTc interval 426 ms.  May 2017 ECG (independently read by me): Sinus rhythm with occasional PVCs.  T-wave inversion V4 through V6, leads 1 and aVL and V2.  LABS:  BMP Latest Ref Rng & Units 05/05/2015 01/03/2013 03/10/2012  Glucose 65 - 99 mg/dL 81 106(H) 158(H)  BUN 7 - 25 mg/dL '15 12 13  ' Creatinine 0.70 - 1.25 mg/dL 1.09 1.1 0.99  Sodium 135 - 146 mmol/L 139 140 137  Potassium 3.5 - 5.3 mmol/L 4.7 4.7 4.5  Chloride 98 - 110 mmol/L 105 106 102  CO2 20 - 31 mmol/L '26 27 26  ' Calcium 8.6 - 10.3 mg/dL 9.2 9.1 9.5     Hepatic Function Latest Ref Rng & Units  06/02/2015 01/03/2013 12/08/2010  Total Protein 6.1 - 8.1 g/dL 6.3 6.9 6.8  Albumin 3.6 - 5.1 g/dL 4.0 3.9 3.9  AST 10 - 35 U/L '17 21 21  ' ALT 9 - 46 U/L 21  29 27  Alk Phosphatase 40 - 115 U/L 58 57 58  Total Bilirubin 0.2 - 1.2 mg/dL 0.6 0.5 0.7  Bilirubin, Direct <=0.2 mg/dL 0.1 0.1 0.1    CBC Latest Ref Rng & Units 05/05/2015 03/10/2012  WBC 3.8 - 10.8 K/uL 7.0 7.5  Hemoglobin 13.2 - 17.1 g/dL 15.7 17.3(H)  Hematocrit 38.5 - 50.0 % 45.8 47.7  Platelets 140 - 400 K/uL 266 252   Lab Results  Component Value Date   MCV 92.2 05/05/2015   MCV 90.7 03/10/2012   Lab Results  Component Value Date   TSH 3.668 03/10/2012   No results found for: HGBA1C   BNP No results found for: BNP  ProBNP No results found for: PROBNP   Lipid Panel     Component Value Date/Time   CHOL 201 (H) 05/12/2015 1012   TRIG 226 (H) 05/12/2015 1012   HDL 35 (L) 05/12/2015 1012   CHOLHDL 5.7 (H) 05/12/2015 1012   VLDL 45 (H) 05/12/2015 1012   LDLCALC 121 05/12/2015 1012   LDLDIRECT 121.4 01/03/2013 1015    RADIOLOGY: No results found.  ------------------------------------------------------------------- 6/5/107 ECHO Study Conclusions  - Left ventricle: The cavity size was normal. There was mild focal   basal hypertrophy of the septum. Systolic function was normal.   The estimated ejection fraction was in the range of 60% to 65%.   Peak LV outflow tract gradient 24 mmHg. Wall motion was normal;   there were no regional wall motion abnormalities. Doppler   parameters are consistent with abnormal left ventricular   relaxation (grade 1 diastolic dysfunction). - Mitral valve: There was mitral valve systolic anterior motion   noted. There was mild regurgitation. - Left atrium: The atrium was mildly dilated. - Right ventricle: The cavity size was normal. Systolic function   was normal. - Pulmonary arteries: No complete TR doppler jet so unable to   estimate PA systolic pressure. - Inferior vena  cava: The vessel was normal in size. The   respirophasic diameter changes were in the normal range (>= 50%),   consistent with normal central venous pressure.  Impressions:  - Normal LV size with mild focal basal septal hypertrophy. There   was LV outflow tract turbulence with peak gradient across the   LVOT of 24 mmHg. Normal RV size and systolic function. There was   mitral valve systolic anterior motion with mild MR. This study is   suggestive of hypertrophic cardiomyopathy.  ASSESSMENT AND PLAN: Mr. Wayne Beck is a 65 year old Caucasian male who has a previous documentation of hypertrophic obstructive cardiomyopathy.  An echo Doppler study in 2015 showed a resting gradient of 15 mm, which increased to 45 mm with Valsalva maneuver.  He had normal systolic function and evidence for severe focal basal hypertrophy.  He has a history of paroxysmal atrial fibrillation and had been maintained on high-dose metoprolol succinate 150 mg twice a day.  Recently, he developed an episode of atrial fibrillation the following day had transient neurologic symptoms with transient visual field loss which ultimately resolved.  He has been taking diltiazem 60 mg on an as-needed basis with breakthrough PAF.  His most recent echo Doppler study confirmed normal LV size with mild focal basal septal hypertrophy.  There was an LVOT gradient of 24 mm suggestive of his hypertrophic cardiomyopathy.  His last echo in 2015 showed a 10-15 mm resting gradient which increased to 45 mg with Valsalva.  I do not believe that the Valsalva maneuver was done at  his most recent echo.  There is evidence for systolic anterior motion of his mitral valve with mild MR. When I saw him 2 months ago, his blood pressure was low and he was significantly bradycardic.  I reduced his Toprol dose to 150 mg in the morning and 100 mg at night.  On this reduced dose he denies any episodes of recurrent arrhythmia or PAF.  Today he continues to be  significantly bradycardic with a pulse of 46, and I will further reduce his nocturnal Toprol to 50 the milligrams but he will continue with the 150 mg dose in the morning.  If he does have recurrent issues of PAF, he may be a candidate for Norpace or amiodarone with his HOCM.  He has experienced recent episodes of chest tightness which are new.  He also admits to episodes of indigestion.  I have suggested the addition of pantoprazole 40 mg to his medical regimen.  It does not appear that he ever has had a stress test.  I'm scheduling him for an exercise Myoview study to further evaluate this chest discomfort.  With reference to his sleep apnea.  He is compliant.  I reviewed his download in detail with him.  His AHI is excellent.  I also reviewed a carotid study which he had done earlier this year which did not show significant plaque burden and had narrowings of less than 39%.  He continues to be on Lipitor 40 mg for hyperlipidemia.  Repeat fasting blood work will be obtained.  I will see me in 6 weeks for follow-up evaluation.  Time spent: 25 minutes  Troy Sine, MD, [facility] ([facility]) 12/10/2015 6:40 PM

## 2015-12-12 ENCOUNTER — Telehealth (HOSPITAL_COMMUNITY): Payer: Self-pay

## 2015-12-12 NOTE — Telephone Encounter (Signed)
Encounter complete. 

## 2015-12-16 ENCOUNTER — Telehealth: Payer: Self-pay | Admitting: *Deleted

## 2015-12-16 NOTE — Telephone Encounter (Signed)
Faxed compliance office note to [facility].

## 2015-12-17 ENCOUNTER — Inpatient Hospital Stay (HOSPITAL_COMMUNITY): Admission: RE | Admit: 2015-12-17 | Payer: Medicare Other | Source: Ambulatory Visit

## 2015-12-19 ENCOUNTER — Telehealth (HOSPITAL_COMMUNITY): Payer: Self-pay

## 2015-12-19 NOTE — Telephone Encounter (Signed)
Encounter complete. 

## 2015-12-23 ENCOUNTER — Telehealth: Payer: Self-pay | Admitting: Physician Assistant

## 2015-12-23 MED ORDER — DILTIAZEM HCL 60 MG PO TABS: ORAL_TABLET | ORAL | 1 refills | Status: AC

## 2015-12-23 NOTE — Telephone Encounter (Signed)
Paged by answering service. Pt need Cardizem refill.

## 2015-12-24 ENCOUNTER — Encounter (HOSPITAL_COMMUNITY): Payer: Medicare Other

## 2016-02-06 ENCOUNTER — Telehealth: Payer: Self-pay | Admitting: Cardiovascular Disease

## 2016-02-06 NOTE — Telephone Encounter (Signed)
New message       Pt has an appt Monday.  He forgot to get is labs drawn prior to appt.  Should he resc or still keep appt and have labs drawn at a later date?

## 2016-02-06 NOTE — Telephone Encounter (Signed)
Spoke to patient, he cannot go for labwork today and has appt Monday. Advised pt OK to do labwork same-day, keep appt as scheduled, we will call once results are reviewed by provider. He voiced understanding and thanks.

## 2016-02-09 ENCOUNTER — Ambulatory Visit: Payer: Medicare Other | Admitting: Cardiovascular Disease

## 2016-02-09 ENCOUNTER — Emergency Department (HOSPITAL_COMMUNITY)
Admission: EM | Admit: 2016-02-09 | Discharge: 2016-03-04 | Disposition: E | Payer: Medicare Other | Attending: Emergency Medicine | Admitting: Emergency Medicine

## 2016-02-09 DIAGNOSIS — I469 Cardiac arrest, cause unspecified: Secondary | ICD-10-CM | POA: Diagnosis not present

## 2016-02-09 DIAGNOSIS — Z7901 Long term (current) use of anticoagulants: Secondary | ICD-10-CM | POA: Diagnosis not present

## 2016-02-09 DIAGNOSIS — Z79899 Other long term (current) drug therapy: Secondary | ICD-10-CM | POA: Diagnosis not present

## 2016-02-09 DIAGNOSIS — I10 Essential (primary) hypertension: Secondary | ICD-10-CM | POA: Diagnosis not present

## 2016-02-09 DIAGNOSIS — Z87891 Personal history of nicotine dependence: Secondary | ICD-10-CM | POA: Insufficient documentation

## 2016-02-09 DIAGNOSIS — R0681 Apnea, not elsewhere classified: Secondary | ICD-10-CM | POA: Insufficient documentation

## 2016-03-04 NOTE — ED Provider Notes (Signed)
By signing my name below, I, Majel Homereyton Lee, attest that this documentation has been prepared under the direction and in the presence of Shon Batonourtney F Horton, MD . Electronically Signed: Majel HomerPeyton Lee, Scribe. 02/20/2016. 2:36 AM.   2:33 AM Patient presents by EMS. He is DOA. Per EMS, they were called out for arrest.  He initially was in V. fib arrest. He was shocked. Since that time he has been in asystole. He received approximately one hour of appropriate ACLS by EMS. Efforts were discontinued in route given poor neurologic exam and continued asystole after appropriate efforts.  2:04 AM Time of Death called by EMS en route to the ED  2:59 AM Patient informed of death. Per chart review, patient has a history of hypertropic obstructive cardiomyopathy, sleep apnea, hypertension. Wife states that he began to have apneic breathing and she noted that he didn't have a pulses.  Patient's family's questions answered and condolences given.   Shon Batonourtney F Horton, MD 06-Mar-2016 0300

## 2016-03-04 NOTE — Progress Notes (Signed)
   08-05-2016 0200  Clinical Encounter Type  Visited With Family  Visit Type Death  Referral From Other (Comment) (Post CPR )  Spiritual Encounters  Spiritual Needs Emotional  Stress Factors  Patient Stress Factors Not reviewed  Family Stress Factors Major life changes  Already in ED when Pt. Arrived DOA. Escorted family to consult B. Medical staff explained situation and family became distraught. [id number]. Provided Pt placement card and NOK information to nursing staff.

## 2016-03-04 DEATH — deceased
